# Patient Record
Sex: Female | Born: 1950 | Race: Black or African American | Hispanic: No | Marital: Married | Smoking: Never smoker
Health system: Southern US, Community
[De-identification: ages and names within clinical notes are randomized; demographics above are authoritative.]

## PROBLEM LIST (undated history)

## (undated) DIAGNOSIS — E109 Type 1 diabetes mellitus without complications: Secondary | ICD-10-CM

## (undated) DIAGNOSIS — E0590 Thyrotoxicosis, unspecified without thyrotoxic crisis or storm: Secondary | ICD-10-CM

## (undated) HISTORY — DX: Type 1 diabetes mellitus without complications: E10.9

## (undated) HISTORY — DX: Thyrotoxicosis, unspecified without thyrotoxic crisis or storm: E05.90

---

## 2012-12-07 DIAGNOSIS — I10 Essential (primary) hypertension: Secondary | ICD-10-CM | POA: Insufficient documentation

## 2015-04-28 DIAGNOSIS — E785 Hyperlipidemia, unspecified: Secondary | ICD-10-CM | POA: Insufficient documentation

## 2015-09-18 LAB — TSH: TSH: 0.08 u[IU]/mL — AB (ref <=5.90)

## 2015-09-18 LAB — HEMOGLOBIN A1C: HEMOGLOBIN A1C: 7.9

## 2015-10-06 LAB — TSH: TSH: 0.11 u[IU]/mL — AB (ref <=5.90)

## 2015-11-26 ENCOUNTER — Encounter: Payer: Self-pay | Admitting: "Endocrinology

## 2015-11-26 ENCOUNTER — Ambulatory Visit (INDEPENDENT_AMBULATORY_CARE_PROVIDER_SITE_OTHER): Payer: Medicare Other | Admitting: "Endocrinology

## 2015-11-26 VITALS — BP 122/66 | HR 93 | Resp 18 | Ht 71.0 in | Wt 151.0 lb

## 2015-11-26 DIAGNOSIS — E0590 Thyrotoxicosis, unspecified without thyrotoxic crisis or storm: Secondary | ICD-10-CM | POA: Diagnosis not present

## 2015-11-26 DIAGNOSIS — D509 Iron deficiency anemia, unspecified: Secondary | ICD-10-CM | POA: Insufficient documentation

## 2015-11-26 DIAGNOSIS — IMO0002 Reserved for concepts with insufficient information to code with codable children: Secondary | ICD-10-CM

## 2015-11-26 DIAGNOSIS — M810 Age-related osteoporosis without current pathological fracture: Secondary | ICD-10-CM | POA: Diagnosis not present

## 2015-11-26 DIAGNOSIS — E1065 Type 1 diabetes mellitus with hyperglycemia: Secondary | ICD-10-CM | POA: Diagnosis not present

## 2015-11-26 DIAGNOSIS — E108 Type 1 diabetes mellitus with unspecified complications: Secondary | ICD-10-CM | POA: Diagnosis not present

## 2015-11-26 DIAGNOSIS — E785 Hyperlipidemia, unspecified: Secondary | ICD-10-CM

## 2015-11-26 DIAGNOSIS — I10 Essential (primary) hypertension: Secondary | ICD-10-CM

## 2015-11-26 NOTE — Progress Notes (Signed)
Subjective:    Patient ID: Kristi Estrada, female    DOB: Oct 01, 1950, PCP Lanier Clam, MD.   No past medical history on file. No past surgical history on file. Social History   Social History  . Marital Status: Married    Spouse Name: N/A  . Number of Children: N/A  . Years of Education: N/A   Social History Main Topics  . Smoking status: Never Smoker   . Smokeless tobacco: None  . Alcohol Use: None  . Drug Use: None  . Sexual Activity: Not Asked   Other Topics Concern  . None   Social History Narrative  . None   Outpatient Encounter Prescriptions as of 11/26/2015  Medication Sig  . alendronate (FOSAMAX) 35 MG tablet TAKE 1 TAB BY MOUTH ONCE A WEEK  . aspirin EC 81 MG tablet Take by mouth.  . B-D ULTRAFINE III SHORT PEN 31G X 8 MM MISC USE SUBCUTANEOUSLY 4 TIMES A DAY  . Blood Glucose Monitoring Suppl (FIFTY50 GLUCOSE METER 2.0) w/Device KIT by Does not apply route.  . cholecalciferol (VITAMIN D) 1000 units tablet Take 1,000 Units by mouth daily.  . hydrochlorothiazide (HYDRODIURIL) 25 MG tablet Take 25 mg by mouth daily.  . Insulin Pen Needle (B-D ULTRAFINE III SHORT PEN) 31G X 8 MM MISC   . LEVEMIR FLEXTOUCH 100 UNIT/ML Pen INJECT 25 UNITS SUBCUTANEOUSLY IN THE MORNING AND 15 UNITS AT NIGHT  . losartan (COZAAR) 50 MG tablet Take by mouth.  . Multiple Vitamin (MULTIVITAMIN) tablet Take 1 tablet by mouth daily.  Marland Kitchen NOVOLOG FLEXPEN 100 UNIT/ML FlexPen INJECT 10 UNITS SUBCUTANEOUSLY THREE TIMES A DAY WITH MEALS  . ONETOUCH DELICA LANCETS FINE MISC by Does not apply route.  . pravastatin (PRAVACHOL) 20 MG tablet    No facility-administered encounter medications on file as of 11/26/2015.   ALLERGIES: Allergies  Allergen Reactions  . Metformin Nausea Only   VACCINATION STATUS:  There is no immunization history on file for this patient.  HPI  The patient presents today with a medical history as above, and is being seen in consultation for hyperthyroidism  requested by Dr. Adela Lank.  The patient has been dealing with symptoms of  Palpitations, sleep disturbance, and fatigue for several weeks.  - She was found to have suppressed TSH on 2 occasions suspicious for hyperthyroidism. -She is not on antithyroid therapy. She denies any exposure to external thyroid hormone products. -She underwent thyroid ultrasound on 11/16/2015 at [facility] which showed right lobe 5.3 cm and left lobe 4.8 cm. This imaging also showed 3.7 x 3.5 x 3.2 mm hypoechoic/solid nodule on the left lobe.  The patient has family history of thyroid dysfunction  in her mother and in one of her daughters,  but the patient denies personal history of goiter.  Patient  is willing to proceed with appropriate work up and therapy for thyrotoxicosis. - She has diabetes type unidentified on insulin treatment.  She was diagnosed 15 years ago at age 25. She is currently on Levemir 27 units in the morning and 15 units at bedtime along with NovoLog 10 units 3 times a day before meals. Her most recent A1c was reported at 7.9%.   Review of Systems Constitutional: no weight gain/loss, + fatigue, +subjective hyperthermia Eyes: no blurry vision, no xerophthalmia ENT: no sore throat, no nodules palpated in throat, no dysphagia/odynophagia, no hoarseness Cardiovascular: + palpitations, - leg swelling Respiratory: no cough/SOB Gastrointestinal: no N/V/D/C Musculoskeletal: no muscle/joint aches  Skin: no rashes Neurological: no tremors/numbness/tingling/dizziness Psychiatric: no depression/anxiety   Objective:    BP 122/66 mmHg  Pulse 93  Resp 18  Ht '5\' 11"'  (1.803 m)  Wt 151 lb (68.493 kg)  BMI 21.07 kg/m2  SpO2 98%  Wt Readings from Last 3 Encounters:  11/26/15 151 lb (68.493 kg)    Physical Exam Constitutional: overweight, in NAD Eyes: PERRLA, EOMI, no exophthalmos ENT: moist mucous membranes, mild thyromegaly, no cervical lymphadenopathy Cardiovascular: She  has mild tachycardia of 104, No MRG Respiratory: CTA B Gastrointestinal: abdomen soft, NT, ND, BS+ Musculoskeletal: no deformities, strength intact in all 4 Skin: moist, warm, no rashes Neurological: no tremor with outstretched hands, DTR normal in all 4  Thyroid ultrasound from 11/16/2015 in [facility] showed Right lobe 5.3 cm x 1.6 cm x 1.9 cm heterogenous and appearance. Left lobe 4.8 cm x 1.9 cm x 1.4 cm heterogenous with a 3.7 mm solid/hypoechoic nodule.   Labs on 09/18/2015 showed free T4 normal at 1.12 TSH suppressed at 0.08, A1c 7.9% Thyroid function test on 10/06/2015 showed free T4 normal at 1.11 TSH suppressed at 0.11 On 10/08/2015 her free T3 was normal at 3.4 vitamin D was low normal at 26  Assessment & Plan:   1. Subclinical hyperthyroidism  -I have reviewed her available records and evaluated patient clinically. She seems to have settled signs and symptoms of hyperthyroidism. -The available workup is not sufficient to make treatment decision. She will need thyroid uptake and scan to confirm the activity level in her thyroid. I will arrange to obtain this study at the closer facility to where she lives. She will return in 2 weeks time for treatment decision if necessary. - The potential risks of untreated thyrotoxicosis and the need for definitive therapy have been discussed in detail with the patient, and the patient agrees to proceed with plan.   Therapy may involve RAI ablation of the thyroid, with subsequent need for lifelong thyroid hormone replacement.  - The subcentimeter nodule (3.7 mm on the left lobe) is unremarkable. It would not need any further workup at this time.  2. Osteoporosis - I do not have access for her DEXA scan to review. I have advised her to continue Fosamax weekly which was already started for her.  3. 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 He did not bring the meter nor logs today. Her most recent A1c was 7.9%. I advised her to stay on same treatment regimen and bring her meter and logs during her next visit.  4. Benign hypertension - Controlled, continue current medications including hydrochlorothiazide 25 mg by mouth daily and Cozaar 50 mg by mouth every day.  5. HLD (hyperlipidemia) - Controlled unknown. Have advised her to continue pravastatin 20 mg by mouth daily at bedtime.  - 40 minutes of time was spent on the care of this patient , 50% of which was applied for counseling on complications of thyrotoxicosis, options ,  and the need for definitive therapy to prevent these complications.  - I advised patient to maintain close follow up with Lanier Clam, MD for primary care needs.  Follow up plan: Return in about 2 weeks (around 12/10/2015) for overactive thyroid.  Glade Lloyd, MD Phone: [phone]  Fax: [phone]   11/26/2015, 4:43 PM

## 2015-12-07 ENCOUNTER — Encounter: Payer: Self-pay | Admitting: "Endocrinology

## 2015-12-10 ENCOUNTER — Ambulatory Visit (INDEPENDENT_AMBULATORY_CARE_PROVIDER_SITE_OTHER): Payer: Medicare Other | Admitting: "Endocrinology

## 2015-12-10 ENCOUNTER — Encounter: Payer: Self-pay | Admitting: "Endocrinology

## 2015-12-10 VITALS — BP 153/80 | HR 107 | Wt 151.5 lb

## 2015-12-10 DIAGNOSIS — E0590 Thyrotoxicosis, unspecified without thyrotoxic crisis or storm: Secondary | ICD-10-CM | POA: Diagnosis not present

## 2015-12-10 MED ORDER — PROPRANOLOL HCL 20 MG PO TABS: 10.0000 mg | ORAL_TABLET | Freq: Three times a day (TID) | ORAL | 2 refills | Status: DC

## 2015-12-10 NOTE — Progress Notes (Signed)
Subjective:    Patient ID: Kristi Estrada, female    DOB: May 22, 1950, PCP Lanier Clam, MD.   No past medical history on file. No past surgical history on file. Social History   Social History  . Marital status: Married    Spouse name: N/A  . Number of children: N/A  . Years of education: N/A   Social History Main Topics  . Smoking status: Never Smoker  . Smokeless tobacco: Not on file  . Alcohol use Not on file  . Drug use: Unknown  . Sexual activity: Not on file   Other Topics Concern  . Not on file   Social History Narrative  . No narrative on file   Outpatient Encounter Prescriptions as of 12/10/2015  Medication Sig  . alendronate (FOSAMAX) 35 MG tablet TAKE 1 TAB BY MOUTH ONCE A WEEK  . aspirin EC 81 MG tablet Take by mouth.  . B-D ULTRAFINE III SHORT PEN 31G X 8 MM MISC USE SUBCUTANEOUSLY 4 TIMES A DAY  . Blood Glucose Monitoring Suppl (FIFTY50 GLUCOSE METER 2.0) w/Device KIT by Does not apply route.  . cholecalciferol (VITAMIN D) 1000 units tablet Take 1,000 Units by mouth daily.  . hydrochlorothiazide (HYDRODIURIL) 25 MG tablet Take 25 mg by mouth daily.  . Insulin Pen Needle (B-D ULTRAFINE III SHORT PEN) 31G X 8 MM MISC   . LEVEMIR FLEXTOUCH 100 UNIT/ML Pen INJECT 25 UNITS SUBCUTANEOUSLY IN THE MORNING AND 15 UNITS AT NIGHT  . losartan (COZAAR) 50 MG tablet Take by mouth.  . Multiple Vitamin (MULTIVITAMIN) tablet Take 1 tablet by mouth daily.  Marland Kitchen NOVOLOG FLEXPEN 100 UNIT/ML FlexPen INJECT 10 UNITS SUBCUTANEOUSLY THREE TIMES A DAY WITH MEALS  . ONETOUCH DELICA LANCETS FINE MISC by Does not apply route.  . pravastatin (PRAVACHOL) 20 MG tablet   . propranolol (INDERAL) 20 MG tablet Take 0.5 tablets (10 mg total) by mouth 3 (three) times daily.   No facility-administered encounter medications on file as of 12/10/2015.    ALLERGIES: Allergies  Allergen Reactions  . Metformin Nausea Only   VACCINATION STATUS:  There is no immunization history on file  for this patient.  HPI  The patient presents today with a medical history as above, and is being seen in consultation for hyperthyroidism requested by Dr. Adela Lank.  The patient has been dealing with symptoms of  Palpitations, sleep disturbance, and fatigue for several weeks.  - She was found to have suppressed TSH on 2 occasions suspicious for hyperthyroidism. -She is not on antithyroid therapy. She denies any exposure to external thyroid hormone products. -She underwent thyroid ultrasound on 11/16/2015 at [facility] which showed right lobe 5.3 cm and left lobe 4.8 cm. This imaging also showed 3.7 x 3.5 x 3.2 mm hypoechoic/solid nodule on the left lobe.  The patient has family history of thyroid dysfunction  in her mother and in one of her daughters,  but the patient denies personal history of goiter.  Patient  is willing to proceed with appropriate work up and therapy for thyrotoxicosis. - She has diabetes type unidentified on insulin treatment.  She was diagnosed 15 years ago at age 34. She is currently on Levemir 27 units in the morning and 15 units at bedtime along with NovoLog 10 units 3 times a day before meals. Her most recent A1c was reported at 7.9%.   Review of Systems Constitutional: no weight gain/loss, + fatigue, +subjective hyperthermia Eyes: no blurry vision, no xerophthalmia  ENT: no sore throat, no nodules palpated in throat, no dysphagia/odynophagia, no hoarseness Cardiovascular: + palpitations, - leg swelling Respiratory: no cough/SOB Gastrointestinal: no N/V/D/C Musculoskeletal: no muscle/joint aches Skin: no rashes Neurological: no tremors/numbness/tingling/dizziness Psychiatric: no depression/anxiety   Objective:    BP (!) 153/80   Pulse (!) 107   Wt 151 lb 8 oz (68.7 kg)   BMI 21.13 kg/m   Wt Readings from Last 3 Encounters:  12/10/15 151 lb 8 oz (68.7 kg)  11/26/15 151 lb (68.5 kg)    Physical Exam Constitutional: overweight,  in NAD Eyes: PERRLA, EOMI, no exophthalmos ENT: moist mucous membranes, mild thyromegaly, no cervical lymphadenopathy Cardiovascular: She has mild tachycardia of 104, No MRG Respiratory: CTA B Gastrointestinal: abdomen soft, NT, ND, BS+ Musculoskeletal: no deformities, strength intact in all 4 Skin: moist, warm, no rashes Neurological: no tremor with outstretched hands, DTR normal in all 4  Thyroid ultrasound from 11/16/2015 in [facility] showed Right lobe 5.3 cm x 1.6 cm x 1.9 cm heterogenous and appearance. Left lobe 4.8 cm x 1.9 cm x 1.4 cm heterogenous with a 3.7 mm solid/hypoechoic nodule.   Labs on 09/18/2015 showed free T4 normal at 1.12 TSH suppressed at 0.08, A1c 7.9% Thyroid function test on 10/06/2015 showed free T4 normal at 1.11 TSH suppressed at 0.11 On 10/08/2015 her free T3 was normal at 3.4 vitamin D was low normal at 26  Assessment & Plan:   1. Subclinical hyperthyroidism - Heart thyroid uptake and scan showed 13% uptake (normal 10-30 percent). - Her presentation still is consistent with subclinical hyperthyroidism. She does have clinical tachycardia along with uncontrolled blood pressure. - She would benefit from a low-dose beta blocker, I would initiate propranolol 20 mg by mouth every morning. - She will have repeat TSH, free T4, free T3 and 3 months with office visit.  - The subcentimeter nodule (3.7 mm on the left lobe) is unremarkable. It would not need any further workup at this time.  2. Osteoporosis - I do not have access for her DEXA scan to review. I have advised her to continue Fosamax weekly which was already started for her.  3. 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 She did not bring the meter nor logs today. Her most recent A1c was 7.9%. I advised her to stay on same treatment regimen and bring her meter and logs during  her next visit.  4. Benign hypertension - unontrolled, continue current medications including hydrochlorothiazide 25 mg by mouth daily and Cozaar 50 mg by mouth every day.  5. HLD (hyperlipidemia) - Controlled unknown. Have advised her to continue pravastatin 20 mg by mouth daily at bedtime.  - 40 minutes of time was spent on the care of this patient , 50% of which was applied for counseling on complications of thyrotoxicosis, options ,  and the need for definitive therapy to prevent these complications.  - I advised patient to maintain close follow up with Lanier Clam, MD for primary care needs.  Follow up plan: Return in about 3 months (around 03/11/2016) for follow up with pre-visit labs.  Glade Lloyd, MD Phone: [phone]  Fax: [phone]   12/10/2015, 2:46 PM

## 2016-01-05 ENCOUNTER — Encounter: Payer: Self-pay | Admitting: "Endocrinology

## 2016-02-10 ENCOUNTER — Other Ambulatory Visit: Payer: Self-pay | Admitting: "Endocrinology

## 2016-03-08 ENCOUNTER — Other Ambulatory Visit: Payer: Self-pay | Admitting: "Endocrinology

## 2016-03-08 LAB — T4, FREE: Free T4: 1.5 ng/dL (ref 0.8–1.8)

## 2016-03-08 LAB — TSH: TSH: 0.05 mIU/L — ABNORMAL LOW

## 2016-03-08 LAB — T3, FREE: T3, Free: 3.8 pg/mL (ref 2.3–4.2)

## 2016-03-17 ENCOUNTER — Ambulatory Visit (INDEPENDENT_AMBULATORY_CARE_PROVIDER_SITE_OTHER): Payer: Medicare Other | Admitting: "Endocrinology

## 2016-03-17 ENCOUNTER — Encounter: Payer: Self-pay | Admitting: "Endocrinology

## 2016-03-17 VITALS — BP 140/74 | HR 76 | Ht 71.0 in | Wt 154.0 lb

## 2016-03-17 DIAGNOSIS — E0590 Thyrotoxicosis, unspecified without thyrotoxic crisis or storm: Secondary | ICD-10-CM | POA: Diagnosis not present

## 2016-03-17 MED ORDER — PROPRANOLOL HCL 20 MG PO TABS: 20.0000 mg | ORAL_TABLET | Freq: Two times a day (BID) | ORAL | 3 refills | Status: DC

## 2016-03-17 NOTE — Progress Notes (Signed)
Subjective:    Patient ID: Kristi Estrada, female    DOB: [birth year], PCP Lanier Clam, MD.   History reviewed. No pertinent past medical history. History reviewed. No pertinent surgical history. Social History   Social History  . Marital status: Married    Spouse name: N/A  . Number of children: N/A  . Years of education: N/A   Social History Main Topics  . Smoking status: Never Smoker  . Smokeless tobacco: None  . Alcohol use None  . Drug use: Unknown  . Sexual activity: Not Asked   Other Topics Concern  . None   Social History Narrative  . None   Outpatient Encounter Prescriptions as of 03/17/2016  Medication Sig  . alendronate (FOSAMAX) 35 MG tablet TAKE 1 TAB BY MOUTH ONCE A WEEK  . aspirin EC 81 MG tablet Take by mouth.  . B-D ULTRAFINE III SHORT PEN 31G X 8 MM MISC USE SUBCUTANEOUSLY 4 TIMES A DAY  . Blood Glucose Monitoring Suppl (FIFTY50 GLUCOSE METER 2.0) w/Device KIT by Does not apply route.  . cholecalciferol (VITAMIN D) 1000 units tablet Take 1,000 Units by mouth daily.  . hydrochlorothiazide (HYDRODIURIL) 25 MG tablet Take 25 mg by mouth daily.  . Insulin Pen Needle (B-D ULTRAFINE III SHORT PEN) 31G X 8 MM MISC   . LEVEMIR FLEXTOUCH 100 UNIT/ML Pen INJECT 25 UNITS SUBCUTANEOUSLY IN THE MORNING AND 15 UNITS AT NIGHT  . losartan (COZAAR) 50 MG tablet Take by mouth.  . Multiple Vitamin (MULTIVITAMIN) tablet Take 1 tablet by mouth daily.  Marland Kitchen NOVOLOG FLEXPEN 100 UNIT/ML FlexPen INJECT 10 UNITS SUBCUTANEOUSLY THREE TIMES A DAY WITH MEALS  . ONETOUCH DELICA LANCETS FINE MISC by Does not apply route.  . pravastatin (PRAVACHOL) 20 MG tablet   . propranolol (INDERAL) 20 MG tablet Take 1 tablet (20 mg total) by mouth 2 (two) times daily.  . [DISCONTINUED] propranolol (INDERAL) 20 MG tablet TAKE 1/2 TABLETS (10 MG TOTAL) BY MOUTH 3 (THREE) TIMES DAILY.   No facility-administered encounter medications on file as of 03/17/2016.    ALLERGIES: Allergies   Allergen Reactions  . Metformin Nausea Only   VACCINATION STATUS:  There is no immunization history on file for this patient.  HPI  The patient presents today with a medical history as above, and is being seen in f/u for  Subclinical hyperthyroidism. She is on observation on propranolol 60m po BID.  - Shedoes not have a new complaints, she has regained 3 lbs. -She is not on antithyroid therapy. She denies any exposure to external thyroid hormone products. -[facility] and left lobe 4.8 cm. This imaging also showed 3.7 x 3.5 x 3.2 mm hypoechoic/solid nodule on the left lobe.  The patient has family history of thyroid dysfunction  in her mother and in one of her daughters,  but the patient denies personal history of goiter.  Patient  is willing to proceed with appropriate work up and therapy for thyrotoxicosis. - She has diabetes type unidentified on insulin treatment.  She was diagnosed 15 years ago at age 65 She is currently on Levemir 27 units in the morning and 15 units at bedtime along with NovoLog 10 units 3 times a day before meals. Her most recent A1c was reported at 7.9%.   Review of Systems Constitutional: + weight gain, -  fatigue, +subjective hyperthermia Eyes: no blurry vision, no xerophthalmia ENT: no  sore throat, no nodules palpated in throat, no dysphagia/odynophagia, no hoarseness Cardiovascular: + palpitations, - leg swelling Respiratory: no cough/SOB Gastrointestinal: no N/V/D/C Musculoskeletal: no muscle/joint aches Skin: no rashes Neurological: no tremors/numbness/tingling/dizziness Psychiatric: no depression/anxiety   Objective:    BP 140/74   Pulse 76   Ht _0  (1.803 m)   Wt 154 lb (69.9 kg)   BMI 21.48 kg/m   Wt Readings from Last 3 Encounters:  03/17/16 154 lb (69.9 kg)  12/10/15 151 lb 8 oz (68.7 kg)  11/26/15 151 lb (68.5 kg)    Physical  Exam Constitutional: in NAD Eyes: PERRLA, EOMI, no exophthalmos ENT: moist mucous membranes, mild thyromegaly, no cervical lymphadenopathy Cardiovascular: She has mild tachycardia of 104, No MRG Respiratory: CTA B Gastrointestinal: abdomen soft, NT, ND, BS+ Musculoskeletal: no deformities, strength intact in all 4 Skin: moist, warm, no rashes Neurological: no tremor with outstretched hands, DTR normal in all 4    Thyroid ultrasound from 11/16/2015 in [facility] showed: Right lobe 5.3 cm x 1.6 cm x 1.9 cm heterogenous and appearance. Left lobe 4.8 cm x 1.9 cm x 1.4 cm heterogenous with a 3.7 mm solid/hypoechoic nodule.  Results for TANI, VIRGO (MRN [id number]) as of 03/17/2016 13:49  Ref. Range 09/18/2015 00:00 10/06/2015 00:00 03/08/2016 11:21  Hemoglobin A1C Unknown 7.9    TSH Latest Units: mIU/L 0.08 (A) 0.11 (A) 0.05 (L)  Triiodothyronine,Free,Serum Latest Ref Range: 2.3 - 4.2 pg/mL   3.8  T4,Free(Direct) Latest Ref Range: 0.8 - 1.8 ng/dL   1.5     Assessment & Plan:   1. Subclinical hyperthyroidism - Her labs and  presentation still is consistent with subclinical hyperthyroidism. Free t4 and free t3 are WNL and TSH is improving to 0.05. She has clinically  responded to low dose propranolol , I will continue 20 MG PO BID. - Her prior thyroid uptake and scan showed 13% uptake (normal 10-30 percent).  - The subcentimeter nodule (3.7 mm on the left lobe) is unremarkable. It will not need any further workup at this time. She will have a f/u thyroid sonogram in 1 year.  2. Osteoporosis - I do not have access for her DEXA scan to review. I have advised her to continue Fosamax weekly which was already started for her.  3. 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 She did not bring the meter nor logs today. Her most recent A1c was 7.9%. She did not bring any  meter nor logs, and patient would like to f/u for diabetes with her PMD.   4. Benign hypertension - Controlled, continue current medications including hydrochlorothiazide 25 mg by mouth daily and Cozaar 50 mg by mouth every day.  5. HLD (hyperlipidemia) - Controlled unknown. Have advised her to continue pravastatin 20 mg by mouth daily at bedtime.  - 30 minutes of time was spent on the care of this patient , 50% of which was applied for counseling on complications of thyrotoxicosis, options ,  and the need for definitive therapy to prevent these complications.  - I advised patient to maintain close follow up with Lanier Clam, MD for primary care needs.  Follow up plan: Return in about 6 months (around 09/14/2016) for follow up with pre-visit labs.  Glade Lloyd, MD Phone: [phone]  Fax: [phone]   03/17/2016, 2:01 PM

## 2016-05-12 ENCOUNTER — Other Ambulatory Visit: Payer: Self-pay | Admitting: "Endocrinology

## 2016-09-08 LAB — TSH: TSH: 0.01 u[IU]/mL — AB (ref 0.41–5.90)

## 2016-09-15 ENCOUNTER — Ambulatory Visit (INDEPENDENT_AMBULATORY_CARE_PROVIDER_SITE_OTHER): Payer: Medicare Other | Admitting: "Endocrinology

## 2016-09-15 ENCOUNTER — Encounter: Payer: Self-pay | Admitting: "Endocrinology

## 2016-09-15 VITALS — BP 130/77 | HR 102 | Ht 71.0 in | Wt 143.0 lb

## 2016-09-15 DIAGNOSIS — E0590 Thyrotoxicosis, unspecified without thyrotoxic crisis or storm: Secondary | ICD-10-CM | POA: Diagnosis not present

## 2016-09-15 MED ORDER — PROPRANOLOL HCL 20 MG PO TABS: ORAL_TABLET | ORAL | 3 refills | Status: DC

## 2016-09-15 NOTE — Progress Notes (Signed)
Subjective:    Patient ID: Kristi Estrada, female    DOB: 18-May-1950, PCP Lanier Clam, MD.   No past medical history on file. No past surgical history on file. Social History   Social History  . Marital status: Married    Spouse name: N/A  . Number of children: N/A  . Years of education: N/A   Social History Main Topics  . Smoking status: Never Smoker  . Smokeless tobacco: Never Used  . Alcohol use No  . Drug use: No  . Sexual activity: Not Asked   Other Topics Concern  . None   Social History Narrative  . None   Outpatient Encounter Prescriptions as of 09/15/2016  Medication Sig  . alendronate (FOSAMAX) 35 MG tablet TAKE 1 TAB BY MOUTH ONCE A WEEK  . aspirin EC 81 MG tablet Take by mouth.  . B-D ULTRAFINE III SHORT PEN 31G X 8 MM MISC USE SUBCUTANEOUSLY 4 TIMES A DAY  . Blood Glucose Monitoring Suppl (FIFTY50 GLUCOSE METER 2.0) w/Device KIT by Does not apply route.  . cholecalciferol (VITAMIN D) 1000 units tablet Take 1,000 Units by mouth daily.  . hydrochlorothiazide (HYDRODIURIL) 25 MG tablet Take 25 mg by mouth daily.  . Insulin Pen Needle (B-D ULTRAFINE III SHORT PEN) 31G X 8 MM MISC   . LEVEMIR FLEXTOUCH 100 UNIT/ML Pen INJECT 25 UNITS SUBCUTANEOUSLY IN THE MORNING AND 15 UNITS AT NIGHT  . losartan (COZAAR) 50 MG tablet Take by mouth.  . Multiple Vitamin (MULTIVITAMIN) tablet Take 1 tablet by mouth daily.  Marland Kitchen NOVOLOG FLEXPEN 100 UNIT/ML FlexPen INJECT 10 UNITS SUBCUTANEOUSLY THREE TIMES A DAY WITH MEALS  . ONETOUCH DELICA LANCETS FINE MISC by Does not apply route.  . pravastatin (PRAVACHOL) 20 MG tablet   . propranolol (INDERAL) 20 MG tablet Take 1 tablet (20 mg total) by mouth 2 (two) times daily.  . propranolol (INDERAL) 20 MG tablet TAKE 1/2 TABLETS (10 MG TOTAL) BY MOUTH 3 (THREE) TIMES DAILY.   No facility-administered encounter medications on file as of 09/15/2016.    ALLERGIES: Allergies  Allergen Reactions  . Metformin Nausea Only    VACCINATION STATUS:  There is no immunization history on file for this patient.  HPI  The patient presents today with a medical history as above, and is being seen in f/u for  Subclinical hyperthyroidism. She is on observation on propranolol 43m po BID.  - She does have a new complaints: Including weight loss of 11 pounds, palpitations, sleep disturbance.  -She is not on antithyroid therapy. She denies any exposure to external thyroid hormone products. -[facility] 5.3 cm and left lobe 4.8 cm. This imaging also showed 3.7 x 3.5 x 3.2 mm hypoechoic/solid nodule on the left lobe. - Heart thyroid uptake and scan from July 2017 was unremarkable at 13%.  The patient has family history of thyroid dysfunction  in her mother and in one of her daughters,  but the patient denies personal history of goiter.  Patient  is willing to proceed with appropriate work up and therapy for thyrotoxicosis. - She has diabetes type unidentified on insulin treatment.  She was diagnosed 15 years ago at age 66 She is currently on Levemir 27 units in the morning and 15 units at bedtime along with NovoLog 10 units 3 times a day before meals. Her most recent A1c was reported at 7.9%.   Review of Systems  Constitutional: + weight loss, -  fatigue, +subjective hyperthermia Eyes: no blurry vision, no xerophthalmia ENT: no sore throat, no nodules palpated in throat, no dysphagia/odynophagia, no hoarseness Cardiovascular: + palpitations, - leg swelling Respiratory: no cough/SOB Gastrointestinal: no N/V/D/C Musculoskeletal: no muscle/joint aches Skin: no rashes Neurological: no tremors/numbness/tingling/dizziness Psychiatric: no depression/anxiety   Objective:    BP 130/77   Pulse (!) 102   Ht '5\' 11"'  (1.803 m)   Wt 143 lb (64.9 kg)   BMI 19.94 kg/m   Wt Readings from Last 3 Encounters:  09/15/16 143 lb (64.9 kg)   03/17/16 154 lb (69.9 kg)  12/10/15 151 lb 8 oz (68.7 kg)    Physical Exam Constitutional: in NAD Eyes: PERRLA, EOMI, no exophthalmos ENT: moist mucous membranes, mild thyromegaly, no cervical lymphadenopathy Cardiovascular: She has mild tachycardia of 104, No MRG Respiratory: CTA B Gastrointestinal: abdomen soft, NT, ND, BS+ Musculoskeletal: no deformities, strength intact in all 4 Skin: moist, warm, no rashes Neurological: no tremor with outstretched hands, DTR normal in all 4  Results for DUANA, BENEDICT (MRN [id number]) as of 09/15/2016 14:10  Ref. Range 03/08/2016 11:21 09/08/2016 00:00  TSH Latest Ref Range: 0.41 - 5.90 uIU/mL 0.05 (L) 0.01 (A) Free T4: 3.04  Triiodothyronine,Free,Serum Latest Ref Range: 2.3 - 4.2 pg/mL 3.8   T4,Free(Direct) Latest Ref Range: 0.8 - 1.8 ng/dL 1.5     Thyroid [facility] 11/16/2015 [facility] showed: Right lobe 5.3 cm x 1.6 cm x 1.9 cm heterogenous and appearance. Left lobe 4.8 cm x 1.9 cm x 1.4 cm heterogenous with a 3.7 mm solid/hypoechoic nodule.    Assessment & Plan:   1.  hyperthyroidism - Her labs And clinical presentation indicate that she has not hyperthyroidism. - Her prior thyroid uptake and scan showed 13% uptake (normal 10-30 percent). - She will need another confirmatory thyroid uptake and scan for treatment decision. She would need higher dose of propranolol, I would increase propranolol to 20 mg by mouth twice a day. - If her repeat thyroid uptake and scan at least with her thyroid function tests, she will be considered for radioactive iodine thyroid ablation.  - The subcentimeter nodule (3.7 mm on the left lobe) is unremarkable. It will not need any further workup at this time.   2. Osteoporosis - I do not have access for her DEXA scan to review. I have advised her to continue Fosamax weekly which was already started for her.  3. 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 She did not bring the meter nor logs today. Her most recent A1c was 7.9%. She did not bring any meter nor logs, and patient would like to f/u for diabetes with her PMD.   4. Benign hypertension - Controlled, continue current medications including hydrochlorothiazide 25 mg by mouth daily and Cozaar 50 mg by mouth every day.  5. HLD (hyperlipidemia) - Controlled unknown. Have advised her to continue pravastatin 20 mg by mouth daily at bedtime.  - 30 minutes of time was spent on the care of this patient , 50% of which was applied for counseling on complications of thyrotoxicosis, options ,  and the need for definitive therapy to prevent these complications.  - I advised patient to maintain close follow up with Lanier Clam, MD for primary care needs.  Follow up plan: Return in about 2 weeks (around 09/29/2016) for thyroid  uptake and scan.  Glade Lloyd, MD Phone: [phone]  Fax: [phone]   09/15/2016, 2:07 PM

## 2016-09-16 ENCOUNTER — Other Ambulatory Visit: Payer: Self-pay

## 2016-09-16 MED ORDER — PROPRANOLOL HCL 20 MG PO TABS: 20.0000 mg | ORAL_TABLET | Freq: Two times a day (BID) | ORAL | 0 refills | Status: DC

## 2016-09-20 ENCOUNTER — Encounter (HOSPITAL_COMMUNITY)
Admission: RE | Admit: 2016-09-20 | Discharge: 2016-09-20 | Disposition: A | Discharge status: Home / Self Care | Payer: Medicare Other | Source: Ambulatory Visit | Attending: "Endocrinology | Admitting: "Endocrinology

## 2016-09-20 ENCOUNTER — Encounter (HOSPITAL_COMMUNITY): Payer: Self-pay

## 2016-09-20 DIAGNOSIS — E0590 Thyrotoxicosis, unspecified without thyrotoxic crisis or storm: Secondary | ICD-10-CM

## 2016-09-20 MED ORDER — SODIUM IODIDE I-123 7.4 MBQ CAPS
400.0000 | ORAL_CAPSULE | Freq: Once | ORAL | Status: AC
  Administered 2016-09-20: 369 via ORAL

## 2016-09-21 ENCOUNTER — Encounter (HOSPITAL_COMMUNITY)
Admission: RE | Admit: 2016-09-21 | Discharge: 2016-09-21 | Disposition: A | Discharge status: Home / Self Care | Payer: Medicare Other | Source: Ambulatory Visit | Attending: "Endocrinology | Admitting: "Endocrinology

## 2016-09-21 DIAGNOSIS — E0590 Thyrotoxicosis, unspecified without thyrotoxic crisis or storm: Secondary | ICD-10-CM | POA: Diagnosis present

## 2016-09-22 ENCOUNTER — Other Ambulatory Visit: Payer: Self-pay

## 2016-09-22 MED ORDER — PROPRANOLOL HCL 20 MG PO TABS: 20.0000 mg | ORAL_TABLET | Freq: Two times a day (BID) | ORAL | 0 refills | Status: DC

## 2016-09-28 ENCOUNTER — Ambulatory Visit (INDEPENDENT_AMBULATORY_CARE_PROVIDER_SITE_OTHER): Payer: Medicare Other | Admitting: "Endocrinology

## 2016-09-28 ENCOUNTER — Encounter: Payer: Self-pay | Admitting: "Endocrinology

## 2016-09-28 VITALS — BP 127/80 | HR 79 | Ht 71.0 in | Wt 142.0 lb

## 2016-09-28 DIAGNOSIS — E0500 Thyrotoxicosis with diffuse goiter without thyrotoxic crisis or storm: Secondary | ICD-10-CM | POA: Diagnosis not present

## 2016-09-28 DIAGNOSIS — E0590 Thyrotoxicosis, unspecified without thyrotoxic crisis or storm: Secondary | ICD-10-CM

## 2016-09-28 NOTE — Progress Notes (Signed)
Subjective:    Patient ID: Kristi Estrada, female    DOB: 09/21/1950, PCP Lanier Clam, MD.   No past medical history on file. No past surgical history on file. Social History   Social History  . Marital status: Married    Spouse name: N/A  . Number of children: N/A  . Years of education: N/A   Social History Main Topics  . Smoking status: Never Smoker  . Smokeless tobacco: Never Used  . Alcohol use No  . Drug use: No  . Sexual activity: Not Asked   Other Topics Concern  . None   Social History Narrative  . None   Outpatient Encounter Prescriptions as of 09/28/2016  Medication Sig  . alendronate (FOSAMAX) 35 MG tablet TAKE 1 TAB BY MOUTH ONCE A WEEK  . aspirin EC 81 MG tablet Take by mouth.  . B-D ULTRAFINE III SHORT PEN 31G X 8 MM MISC USE SUBCUTANEOUSLY 4 TIMES A DAY  . Blood Glucose Monitoring Suppl (FIFTY50 GLUCOSE METER 2.0) w/Device KIT by Does not apply route.  . cholecalciferol (VITAMIN D) 1000 units tablet Take 1,000 Units by mouth daily.  . hydrochlorothiazide (HYDRODIURIL) 25 MG tablet Take 25 mg by mouth daily.  . Insulin Pen Needle (B-D ULTRAFINE III SHORT PEN) 31G X 8 MM MISC   . LEVEMIR FLEXTOUCH 100 UNIT/ML Pen INJECT 25 UNITS SUBCUTANEOUSLY IN THE MORNING AND 15 UNITS AT NIGHT  . losartan (COZAAR) 50 MG tablet Take by mouth.  . Multiple Vitamin (MULTIVITAMIN) tablet Take 1 tablet by mouth daily.  Marland Kitchen NOVOLOG FLEXPEN 100 UNIT/ML FlexPen INJECT 10 UNITS SUBCUTANEOUSLY THREE TIMES A DAY WITH MEALS  . ONETOUCH DELICA LANCETS FINE MISC by Does not apply route.  . pravastatin (PRAVACHOL) 20 MG tablet   . propranolol (INDERAL) 20 MG tablet Take 1 tablet (20 mg total) by mouth 2 (two) times daily.   No facility-administered encounter medications on file as of 09/28/2016.    ALLERGIES: Allergies  Allergen Reactions  . Metformin Nausea Only   VACCINATION STATUS:  There is no immunization history on file for this patient.  HPI  The patient  presents today with a medical history as above, and is being seen in f/u for  Subclinical hyperthyroidism. She is on observation on propranolol 44m po BID.  - She does have a new complaints: Including weight loss of 11 pounds, palpitations, sleep disturbance.  -She is not on antithyroid therapy. She denies any exposure to external thyroid hormone products. -[facility] and left lobe 4.8 cm. This imaging also showed 3.7 x 3.5 x 3.2 mm hypoechoic/solid nodule on the left lobe. - Her repeat thyroid uptake and scan shows that she has uniform uptake of 50.8% consistent with Graves' disease.  The patient has family history of thyroid dysfunction  in her mother and in one of her daughters,  but the patient denies personal history of goiter.  Patient  is willing to proceed with appropriate work up and therapy for thyrotoxicosis. - She has diabetes type unidentified on insulin treatment.  She was diagnosed 15 years ago at age 89 She is currently on Levemir 27 units in the morning and 15 units at bedtime along with NovoLog 10 units 3 times a day before meals. Her most recent A1c was reported at 7.9%.   Review of Systems Constitutional: + weight loss, -  fatigue, +subjective hyperthermia Eyes: no blurry vision,  no xerophthalmia ENT: no sore throat, no nodules palpated in throat, no dysphagia/odynophagia, no hoarseness Cardiovascular: + palpitations, - leg swelling Respiratory: no cough/SOB Gastrointestinal: no N/V/D/C Musculoskeletal: no muscle/joint aches Skin: no rashes Neurological: no tremors/numbness/tingling/dizziness Psychiatric: no depression/anxiety   Objective:    BP 127/80   Pulse 79   Ht '5\' 11"'  (1.803 m)   Wt 142 lb (64.4 kg)   BMI 19.80 kg/m   Wt Readings from Last 3 Encounters:  09/28/16 142 lb (64.4 kg)  09/15/16 143 lb (64.9 kg)  03/17/16 154 lb (69.9 kg)    Physical  Exam Constitutional: in NAD Eyes: PERRLA, EOMI, no exophthalmos ENT: moist mucous membranes, mild thyromegaly, no cervical lymphadenopathy Cardiovascular: She has mild tachycardia of 104, No MRG Respiratory: CTA B Gastrointestinal: abdomen soft, NT, ND, BS+ Musculoskeletal: no deformities, strength intact in all 4 Skin: moist, warm, no rashes Neurological: no tremor with outstretched hands, DTR normal in all 4  Results for Kristi, Estrada (MRN [id number]) as of 09/15/2016 14:10  Ref. Range 03/08/2016 11:21 09/08/2016 00:00  TSH Latest Ref Range: 0.41 - 5.90 uIU/mL 0.05 (L) 0.01 (A) Free T4: 3.04  Triiodothyronine,Free,Serum Latest Ref Range: 2.3 - 4.2 pg/mL 3.8   T4,Free(Direct) Latest Ref Range: 0.8 - 1.8 ng/dL 1.5     Thyroid ultrasound from 11/16/2015 in [facility] showed: Right lobe 5.3 cm x 1.6 cm x 1.9 cm heterogenous and appearance. Left lobe 4.8 cm x 1.9 cm x 1.4 cm heterogenous with a 3.7 mm solid/hypoechoic nodule.  -  On 09/21/2016: Thyroid uptake and scan showed 50.8% of uniform uptake consistent with Graves' disease.   Assessment & Plan:   1.  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 -Options of therapy discussed with her and she agrees with RAI thyroid ablation. She understands the subsequent need for thyroid hormone replacement. This treatment will be scheduled to be administered as soon as possible. -She will return in 9 weeks with repeat thyroid function test.  She will benefit from propranolol, I advised her to continue propranolol  20 mg by mouth twice a day.   - The subcentimeter nodule (3.7 mm on the left lobe) is unremarkable. It will not need any further workup at this time.   2. Osteoporosis - I do not have access for her DEXA scan to review. I have advised her to continue Fosamax weekly which was already started for her.  3. Uncontrolled type 1 diabetes mellitus with complication  (HCC) - Given her body habitus, it is likely that her diabetes is type I instead of type II. She did not bring the meter nor logs today. Her most recent A1c was 7.9%. She did not bring any meter nor logs, and patient would like to f/u for diabetes with her PMD.   4. Benign hypertension - Controlled, continue current medications including hydrochlorothiazide 25 mg by mouth daily and Cozaar 50 mg by mouth every day.  5. HLD (hyperlipidemia) - Controlled unknown. Have advised her to continue pravastatin 20 mg by mouth daily at bedtime.  - I advised patient to maintain close follow up with Lanier Clam, MD for primary care needs.  Follow up plan: Return in about 9 weeks (around 11/30/2016) for follow up with labs after I131 therapy.  Glade Lloyd, MD Phone: [phone]  Fax: [phone]   09/28/2016, 1:12 PM

## 2016-09-29 ENCOUNTER — Ambulatory Visit: Payer: Medicare Other | Admitting: "Endocrinology

## 2016-10-07 ENCOUNTER — Encounter (HOSPITAL_COMMUNITY)
Admission: RE | Admit: 2016-10-07 | Discharge: 2016-10-07 | Disposition: A | Discharge status: Home / Self Care | Payer: Medicare Other | Source: Ambulatory Visit | Attending: "Endocrinology | Admitting: "Endocrinology

## 2016-10-07 ENCOUNTER — Encounter (HOSPITAL_COMMUNITY): Payer: Self-pay

## 2016-10-07 DIAGNOSIS — E0590 Thyrotoxicosis, unspecified without thyrotoxic crisis or storm: Secondary | ICD-10-CM | POA: Diagnosis present

## 2016-10-07 IMAGING — NM NM RAI THERAPY FOR HYPERTHYROIDISM
2 series · 2 of 2 positions shown · non-contrast
Comparison: Thyroid uptake and scan [DATE]

CLINICAL DATA: Hyperthyroidism, Graves disease

EXAM:
RADIOACTIVE IODINE THERAPY FOR HYPERTHYROIDISM
TECHNIQUE: Procedure, benefits and risks of radioactive iodine therapy for
hyperthyroidism were discussed with the patient, as well as
alternatives.

[Series 1: bone statics · 2.07mm/px · 1 of 1 slices shown (1 of 2)]
[im 1/1]
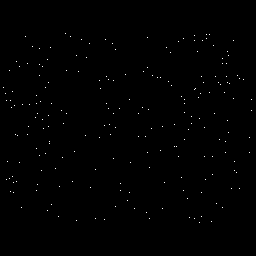

[Series 1: bone statics · 2.07mm/px · 1 of 1 slices shown (2 of 2)]
[im 1/1]
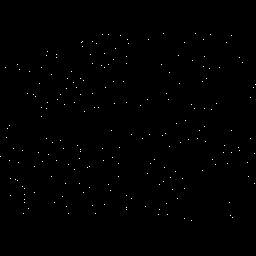

[2 of 2 positions shown; findings below may reference images not displayed]

Radiation safety issues and procedures were discussed, including
minimization of radiation risk to family and general public.

Written safety instructions were provided to the patient.

Patient's questions were answered.

Written informed consent for the therapy was obtained.

Timeout protocol followed.

Patient then received the radiopharmaceutical without immediate
complication.

RADIOPHARMACEUTICALS:  12.2 mCi [DH] sodium iodide orally
IMPRESSION: Per oral administration of 12.2 mCi of [DH] sodium iodide for the
treatment of hyperthyroidism.

## 2016-10-07 MED ORDER — SODIUM IODIDE I 131 CAPSULE
12.0000 | Freq: Once | INTRAVENOUS | Status: AC | PRN
  Administered 2016-10-07: 12 via ORAL

## 2016-11-29 LAB — TSH: TSH: 0.11 — AB (ref 0.41–5.90)

## 2016-12-06 ENCOUNTER — Encounter: Payer: Self-pay | Admitting: "Endocrinology

## 2016-12-06 ENCOUNTER — Ambulatory Visit (INDEPENDENT_AMBULATORY_CARE_PROVIDER_SITE_OTHER): Payer: Medicare Other | Admitting: "Endocrinology

## 2016-12-06 VITALS — BP 142/76 | HR 71 | Ht 71.0 in | Wt 146.0 lb

## 2016-12-06 DIAGNOSIS — IMO0002 Reserved for concepts with insufficient information to code with codable children: Secondary | ICD-10-CM

## 2016-12-06 DIAGNOSIS — E890 Postprocedural hypothyroidism: Secondary | ICD-10-CM | POA: Insufficient documentation

## 2016-12-06 DIAGNOSIS — E0500 Thyrotoxicosis with diffuse goiter without thyrotoxic crisis or storm: Secondary | ICD-10-CM | POA: Diagnosis not present

## 2016-12-06 DIAGNOSIS — E108 Type 1 diabetes mellitus with unspecified complications: Secondary | ICD-10-CM

## 2016-12-06 DIAGNOSIS — E1065 Type 1 diabetes mellitus with hyperglycemia: Secondary | ICD-10-CM

## 2016-12-06 NOTE — Progress Notes (Signed)
Subjective:    Patient ID: Kristi Estrada, female    DOB: [birth year], PCP Lanier Clam, MD.   History reviewed. No pertinent past medical history. History reviewed. No pertinent surgical history. Social History   Social History  . Marital status: Married    Spouse name: N/A  . Number of children: N/A  . Years of education: N/A   Social History Main Topics  . Smoking status: Never Smoker  . Smokeless tobacco: Never Used  . Alcohol use No  . Drug use: No  . Sexual activity: Not Asked   Other Topics Concern  . None   Social History Narrative  . None   Outpatient Encounter Prescriptions as of 12/06/2016  Medication Sig  . alendronate (FOSAMAX) 35 MG tablet TAKE 1 TAB BY MOUTH ONCE A WEEK  . aspirin EC 81 MG tablet Take by mouth.  . B-D ULTRAFINE III SHORT PEN 31G X 8 MM MISC USE SUBCUTANEOUSLY 4 TIMES A DAY  . Blood Glucose Monitoring Suppl (FIFTY50 GLUCOSE METER 2.0) w/Device KIT by Does not apply route.  . cholecalciferol (VITAMIN D) 1000 units tablet Take 1,000 Units by mouth daily.  . hydrochlorothiazide (HYDRODIURIL) 25 MG tablet Take 25 mg by mouth daily.  . Insulin Pen Needle (B-D ULTRAFINE III SHORT PEN) 31G X 8 MM MISC   . LEVEMIR FLEXTOUCH 100 UNIT/ML Pen INJECT 25 UNITS SUBCUTANEOUSLY IN THE MORNING AND 15 UNITS AT NIGHT  . losartan (COZAAR) 50 MG tablet Take by mouth.  . Multiple Vitamin (MULTIVITAMIN) tablet Take 1 tablet by mouth daily.  Marland Kitchen NOVOLOG FLEXPEN 100 UNIT/ML FlexPen INJECT 10 UNITS SUBCUTANEOUSLY THREE TIMES A DAY WITH MEALS  . ONETOUCH DELICA LANCETS FINE MISC by Does not apply route.  . pravastatin (PRAVACHOL) 20 MG tablet   . [DISCONTINUED] propranolol (INDERAL) 20 MG tablet Take 1 tablet (20 mg total) by mouth 2 (two) times daily.   No facility-administered encounter medications on file as of 12/06/2016.    ALLERGIES: Allergies  Allergen Reactions  . Metformin Nausea Only   VACCINATION STATUS:  There is no immunization history on  file for this patient.  HPI  The patient presents today with a medical history as above, and is being seen in f/u for   Graves' disease status post  RAI Therapy on 10/07/2016.  - He reports improvement in her clinical symptoms. -She underwent thyroid ultrasound on 11/16/[facility] which showed right lobe 5.3 cm and left lobe 4.8 cm. This imaging also showed 3.7 x 3.5 x 3.2 mm hypoechoic/solid nodule on the left lobe. - Her repeat thyroid uptake and scan shows that she has uniform uptake of 50.8% consistent with Graves' disease.  The patient has family history of thyroid dysfunction  in her mother and in one of her daughters,  but the patient denies personal history of goiter.   - She has diabetes type unidentified on insulin treatment.  She was diagnosed 15 years ago at age 59. She is currently on Levemir 27 units in the morning and 15 units at bedtime along with NovoLog 10 units 3 times a day before meals. Her most recent A1c was reported at 7.9%.   Review of Systems Constitutional: + weight gain, -  fatigue, -subjective hyperthermia Eyes: no blurry vision, no xerophthalmia ENT: no sore throat, no nodules palpated in throat, no dysphagia/odynophagia, no hoarseness Cardiovascular: + palpitations, - leg swelling Respiratory: no cough/SOB Gastrointestinal: no N/V/D/C Musculoskeletal: no muscle/joint aches Skin: no rashes Neurological: no  tremors/numbness/tingling/dizziness Psychiatric: no depression/anxiety   Objective:    BP (!) 142/76   Pulse 71   Ht '5\' 11"'  (1.803 m)   Wt 146 lb (66.2 kg)   BMI 20.36 kg/m   Wt Readings from Last 3 Encounters:  12/06/16 146 lb (66.2 kg)  09/28/16 142 lb (64.4 kg)  09/15/16 143 lb (64.9 kg)    Physical Exam Constitutional: in NAD Eyes: PERRLA, EOMI, no exophthalmos ENT: moist mucous membranes, mild thyromegaly, no cervical lymphadenopathy Cardiovascular: She has mild tachycardia of 104, No MRG Respiratory: CTA  B Gastrointestinal: abdomen soft, NT, ND, BS+ Musculoskeletal: no deformities, strength intact in all 4 Skin: moist, warm, no rashes Neurological: no tremor with outstretched hands, DTR normal in all 4  11/29/2016 TSH 0.11, free T4 0.95  Thyroid ultrasound from 11/16/2015 in [facility] showed: Right lobe 5.3 cm x 1.6 cm x 1.9 cm heterogenous and appearance. Left lobe 4.8 cm x 1.9 cm x 1.4 cm heterogenous with a 3.7 mm solid/hypoechoic nodule.  -  On 09/21/2016: Thyroid uptake and scan showed 50.8% of uniform uptake consistent with Graves' disease.   Assessment & Plan:   1.  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 - Based on her thyroid function tests, she is not hypothyroid unit. -She will return in 8 weeks with repeat thyroid function test. - I advised her to discontinue propranolol for now. - The subcentimeter nodule (3.7 mm on the left lobe) is unremarkable. It will not need any further workup at this time.   2. Osteoporosis - I do not have access for her DEXA scan to review. I have advised her to continue Fosamax weekly which was already started for her.  3. 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 She did not bring the meter nor logs today. Her most recent A1c was 7.9%. She did not bring any meter nor logs, and patient would like to f/u for diabetes with her PMD.   4. Benign hypertension - Controlled, continue current medications including hydrochlorothiazide 25 mg by mouth daily and Cozaar 50 mg by mouth every day.  5. HLD (hyperlipidemia) - Controlled unknown. Have advised her to continue pravastatin 20 mg by mouth daily at bedtime.  - I advised patient to maintain close follow up with Lanier Clam, MD for primary care needs.  Follow up  plan: Return in about 8 weeks (around 01/31/2017) for follow up with pre-visit labs.  Glade Lloyd, MD Phone: [phone]  Fax: [phone]   12/06/2016, 4:26 PM

## 2017-01-31 ENCOUNTER — Ambulatory Visit (INDEPENDENT_AMBULATORY_CARE_PROVIDER_SITE_OTHER): Payer: Medicare Other | Admitting: "Endocrinology

## 2017-01-31 ENCOUNTER — Encounter: Payer: Self-pay | Admitting: "Endocrinology

## 2017-01-31 VITALS — BP 139/83 | HR 83 | Ht 71.0 in | Wt 156.0 lb

## 2017-01-31 DIAGNOSIS — E890 Postprocedural hypothyroidism: Secondary | ICD-10-CM | POA: Diagnosis not present

## 2017-01-31 MED ORDER — LEVOTHYROXINE SODIUM 88 MCG PO TABS: 88.0000 ug | ORAL_TABLET | Freq: Every day | ORAL | 3 refills | Status: DC

## 2017-01-31 NOTE — Progress Notes (Signed)
Subjective:    Patient ID: Kristi Estrada, female    DOB: [birth year], PCP Lanier Clam, MD.   No past medical history on file. No past surgical history on file. Social History   Social History  . Marital status: Married    Spouse name: N/A  . Number of children: N/A  . Years of education: N/A   Social History Main Topics  . Smoking status: Never Smoker  . Smokeless tobacco: Never Used  . Alcohol use No  . Drug use: No  . Sexual activity: Not on file   Other Topics Concern  . Not on file   Social History Narrative  . No narrative on file   Outpatient Encounter Prescriptions as of 01/31/2017  Medication Sig  . alendronate (FOSAMAX) 35 MG tablet TAKE 1 TAB BY MOUTH ONCE A WEEK  . aspirin EC 81 MG tablet Take by mouth.  . B-D ULTRAFINE III SHORT PEN 31G X 8 MM MISC USE SUBCUTANEOUSLY 4 TIMES A DAY  . cholecalciferol (VITAMIN D) 1000 units tablet Take 1,000 Units by mouth daily.  . hydrochlorothiazide (HYDRODIURIL) 25 MG tablet Take 25 mg by mouth daily.  Marland Kitchen LEVEMIR FLEXTOUCH 100 UNIT/ML Pen INJECT 25 UNITS SUBCUTANEOUSLY IN THE MORNING AND 15 UNITS AT NIGHT  . levothyroxine (SYNTHROID, LEVOTHROID) 88 MCG tablet Take 1 tablet (88 mcg total) by mouth daily before breakfast.  . losartan (COZAAR) 50 MG tablet Take by mouth.  . Multiple Vitamin (MULTIVITAMIN) tablet Take 1 tablet by mouth daily.  Marland Kitchen NOVOLOG FLEXPEN 100 UNIT/ML FlexPen INJECT 10 UNITS SUBCUTANEOUSLY THREE TIMES A DAY WITH MEALS  . ONETOUCH DELICA LANCETS FINE MISC by Does not apply route.  . pravastatin (PRAVACHOL) 20 MG tablet   . [DISCONTINUED] Blood Glucose Monitoring Suppl (FIFTY50 GLUCOSE METER 2.0) w/Device KIT by Does not apply route.  . [DISCONTINUED] Insulin Pen Needle (B-D ULTRAFINE III SHORT PEN) 31G X 8 MM MISC    No facility-administered encounter medications on file as of 01/31/2017.    ALLERGIES: Allergies  Allergen Reactions  . Metformin Nausea Only   VACCINATION STATUS:  There is  no immunization history on file for this patient.  HPI  The patient presents today with a medical history as above, and is being seen in f/u for   Graves' disease status post  RAI Therapy on 10/07/2016.  - He reports improvement in her clinical symptoms. -She underwent thyroid ultrasound on 11/16/2015 at [facility] which showed right lobe 5.3 cm and left lobe 4.8 cm. This imaging also showed 3.7 x 3.5 x 3.2 mm hypoechoic/solid nodule on the left lobe. - Her repeat thyroid uptake and scan shows that she has uniform uptake of 50.8% consistent with Graves' disease.  The patient has family history of thyroid dysfunction  in her mother and in one of her daughters,  but the patient denies personal history of goiter.   - She has diabetes type unidentified on insulin treatment.  She was diagnosed 15 years ago at age 36. She is currently on Levemir 27 units in the morning and 15 units at bedtime along with NovoLog 10 units 3 times a day before meals. Her most recent A1c was reported at 7.9%, she wishes to continue care with PMD for diabetes.   Review of Systems Constitutional: + weight gain, + fatigue, -subjective hyperthermia Eyes: no blurry vision, no xerophthalmia ENT: no sore throat, no nodules palpated in throat, no dysphagia/odynophagia, no hoarseness Cardiovascular: + palpitations, - leg swelling Respiratory:  no cough/SOB Gastrointestinal: no N/V/D/C Musculoskeletal: no muscle/joint aches Skin: no rashes Neurological: no tremors/numbness/tingling/dizziness Psychiatric: no depression/anxiety   Objective:    BP 139/83   Pulse 83   Ht '5\' 11"'  (1.803 m)   Wt 156 lb (70.8 kg)   BMI 21.76 kg/m   Wt Readings from Last 3 Encounters:  01/31/17 156 lb (70.8 kg)  12/06/16 146 lb (66.2 kg)  09/28/16 142 lb (64.4 kg)    Physical Exam Constitutional: in NAD Eyes: PERRLA, EOMI, no exophthalmos ENT: moist mucous membranes, mild thyromegaly, no cervical  lymphadenopathy Cardiovascular: She has mild tachycardia of 104, No MRG Respiratory: CTA B Gastrointestinal: abdomen soft, NT, ND, BS+ Musculoskeletal: no deformities, strength intact in all 4 Skin: moist, warm, no rashes Neurological: no tremor with outstretched hands, DTR normal in all 4  11/29/2016 TSH 0.11, free T4 0.95 01/24/2017 TSH 110. 7, free T4 0.12  Thyroid ultrasound from 11/16/2015 in [facility] showed: Right lobe 5.3 cm x 1.6 cm x 1.9 cm heterogenous and appearance. Left lobe 4.8 cm x 1.9 cm x 1.4 cm heterogenous with a 3.7 mm solid/hypoechoic nodule.  -  On 09/21/2016: Thyroid uptake and scan showed 50.8% of uniform uptake consistent with Graves' disease.   Assessment & Plan:   1. 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 - Based on her thyroid function tests, she is now hypothyroid . - I discussed and initiated thyroid hormone replacement therapy .  - I will start with levothyroxine 88 g by mouth every morning.  - We discussed about correct intake of levothyroxine, at fasting, with water, separated by at least 30 minutes from breakfast, and separated by more than 4 hours from calcium, iron, multivitamins, acid reflux medications (PPIs). -Patient is made aware of the fact that thyroid hormone replacement is needed for life, dose to be adjusted by periodic monitoring of thyroid function tests. - The subcentimeter nodule (3.7 mm on the left lobe) is unremarkable. It will not need any further workup at this time.  - I advised patient to maintain close follow up with Lanier Clam, MD for primary care needs.  Follow up plan: Return in about 3 months (around 05/02/2017) for follow up with pre-visit labs.  Glade Lloyd, MD Phone: [phone]  Fax: [phone]   This note was partially dictated with voice recognition software. Similar  sounding words can be transcribed inadequately or may not  be corrected upon review.  01/31/2017, 2:40 PM

## 2017-05-10 ENCOUNTER — Encounter: Payer: Self-pay | Admitting: "Endocrinology

## 2017-05-10 ENCOUNTER — Ambulatory Visit: Payer: Medicare FFS | Admitting: "Endocrinology

## 2017-05-10 VITALS — BP 136/84 | HR 85 | Ht 71.0 in | Wt 145.0 lb

## 2017-05-10 DIAGNOSIS — E0500 Thyrotoxicosis with diffuse goiter without thyrotoxic crisis or storm: Secondary | ICD-10-CM | POA: Diagnosis not present

## 2017-05-10 DIAGNOSIS — E890 Postprocedural hypothyroidism: Secondary | ICD-10-CM | POA: Diagnosis not present

## 2017-05-10 MED ORDER — LEVOTHYROXINE SODIUM 75 MCG PO TABS: 75.0000 ug | ORAL_TABLET | Freq: Every day | ORAL | 4 refills | Status: DC

## 2017-05-10 NOTE — Progress Notes (Signed)
Subjective:    Patient ID: Kristi Estrada, female    DOB: 05/27/1950, PCP Remigio EisenmengerBalakrishnan, Shyam E, MD.   History reviewed. No pertinent past medical history. History reviewed. No pertinent surgical history. Social History   Socioeconomic History  . Marital status: Married    Spouse name: None  . Number of children: None  . Years of education: None  . Highest education level: None  Social Needs  . Financial resource strain: None  . Food insecurity - worry: None  . Food insecurity - inability: None  . Transportation needs - medical: None  . Transportation needs - non-medical: None  Occupational History  . None  Tobacco Use  . Smoking status: Never Smoker  . Smokeless tobacco: Never Used  Substance and Sexual Activity  . Alcohol use: No    Alcohol/week: 0.0 oz  . Drug use: No  . Sexual activity: None  Other Topics Concern  . None  Social History Narrative  . None   Outpatient Encounter Medications as of 05/10/2017  Medication Sig  . alendronate (FOSAMAX) 35 MG tablet TAKE 1 TAB BY MOUTH ONCE A WEEK  . aspirin EC 81 MG tablet Take by mouth.  . B-D ULTRAFINE III SHORT PEN 31G X 8 MM MISC USE SUBCUTANEOUSLY 4 TIMES A DAY  . cholecalciferol (VITAMIN D) 1000 units tablet Take 1,000 Units by mouth daily.  . hydrochlorothiazide (HYDRODIURIL) 25 MG tablet Take 25 mg by mouth daily.  Marland Kitchen. LEVEMIR FLEXTOUCH 100 UNIT/ML Pen INJECT 25 UNITS SUBCUTANEOUSLY IN THE MORNING AND 15 UNITS AT NIGHT  . levothyroxine (SYNTHROID, LEVOTHROID) 75 MCG tablet Take 1 tablet (75 mcg total) by mouth daily before breakfast.  . losartan (COZAAR) 50 MG tablet Take by mouth.  . Multiple Vitamin (MULTIVITAMIN) tablet Take 1 tablet by mouth daily.  Marland Kitchen. NOVOLOG FLEXPEN 100 UNIT/ML FlexPen INJECT 10 UNITS SUBCUTANEOUSLY THREE TIMES A DAY WITH MEALS  . ONETOUCH DELICA LANCETS FINE MISC by Does not apply route.  . pravastatin (PRAVACHOL) 20 MG tablet   . [DISCONTINUED] levothyroxine (SYNTHROID, LEVOTHROID) 88 MCG  tablet Take 1 tablet (88 mcg total) by mouth daily before breakfast.   No facility-administered encounter medications on file as of 05/10/2017.    ALLERGIES: Allergies  Allergen Reactions  . Metformin Nausea Only   VACCINATION STATUS:  There is no immunization history on file for this patient.  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  - She was initiated on levothyroxine 88 g by mouth every morning. She tolerated this medication very well with subsequent improvement in her clinical symptoms. -She [facility] which showed right lobe 5.3 cm and left lobe 4.8 cm. This imaging also showed 3.7 x 3.5 x 3.2 mm hypoechoic/solid nodule on the left lobe. - Her repeat thyroid uptake and scan shows that she has uniform uptake of 50.8% consistent with Graves' disease.  The patient has family history of thyroid dysfunction  in her mother and in one of her daughters,  but the patient denies personal history of goiter.   - She has diabetes type unidentified on insulin treatment.  She was diagnosed 15 years ago at age 67. She is currently on Levemir 27 units in the morning and 15 units at bedtime along with NovoLog 10 units 3 times a day before meals. Her most  recent A1c was reported at 7.9%, she wishes to continue care with PMD for diabetes. - She is currently grieving from the sudden death of her husband in 05-13-17.   Review of Systems Constitutional: + weight loss, - fatigue, -subjective hyperthermia Eyes: no blurry vision, no xerophthalmia ENT: no sore throat, no nodules palpated in throat, no dysphagia/odynophagia, no hoarseness Cardiovascular: + palpitations, - leg swelling Respiratory: no cough/SOB Gastrointestinal: no N/V/D/C Musculoskeletal: no muscle/joint aches Skin: no  rashes Neurological: no tremors/numbness/tingling/dizziness Psychiatric: no depression/anxiety   Objective:    BP 136/84   Pulse 85   Ht 5\' 11"  (1.803 m)   Wt 145 lb (65.8 kg)   BMI 20.22 kg/m   Wt Readings from Last 3 Encounters:  05/10/17 145 lb (65.8 kg)  01/31/17 156 lb (70.8 kg)  12/06/16 146 lb (66.2 kg)    Physical Exam Constitutional: in NAD Eyes: PERRLA, EOMI, no exophthalmos ENT: moist mucous membranes, mild thyromegaly, no cervical lymphadenopathy Cardiovascular: She has mild tachycardia of 104, No MRG Respiratory: CTA B Gastrointestinal: abdomen soft, NT, ND, BS+ Musculoskeletal: no deformities, strength intact in all 4 Skin: moist, warm, no rashes Neurological: no tremor with outstretched hands, DTR normal in all 4  Labs from 05/01/2017 showed TSH 6.33, free T4 2.17  Thyroid ultrasound from 11/16/2015 in [facility] showed: Right lobe 5.3 cm x 1.6 cm x 1.9 cm heterogenous and appearance. Left lobe 4.8 cm x 1.9 cm x 1.4 cm heterogenous with a 3.7 mm solid/hypoechoic nodule.  -  On 09/21/2016: Thyroid uptake and scan showed 50.8% of uniform uptake consistent with Graves' disease.   Assessment & Plan:   1. 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 Graves' disease-  Resolved 3. Nodular goiter - Based on her current thyroid function tests, she will be considered for a lower dose of levothyroxine. - I discussed and lowered her levothyroxine to 75 g by mouth every morning.   - We discussed about correct intake of levothyroxine, at fasting, with water, separated by at least 30 minutes from breakfast, and separated by more than 4 hours from calcium, iron, multivitamins, acid reflux medications (PPIs). -Patient is made aware of the fact that thyroid hormone replacement is needed for life, dose to be adjusted by periodic monitoring of thyroid function tests. - The subcentimeter nodules (3.7 mm on the left lobe) is unremarkable. It will not need any  further workup at this time.  - I advised patient to maintain close follow up with Remigio Eisenmenger, MD for primary care needs.  Follow up plan: Return in about 5 months (around 10/08/2017) for follow up with pre-visit labs.  Marquis Lunch, MD Phone: [phone]  Fax: [phone]   This note was partially dictated with voice recognition software. Similar sounding words can be transcribed inadequately or may not  be corrected upon review.  05/10/2017, 2:03 PM

## 2017-05-31 ENCOUNTER — Other Ambulatory Visit: Payer: Self-pay | Admitting: "Endocrinology

## 2017-09-04 ENCOUNTER — Other Ambulatory Visit: Payer: Self-pay | Admitting: "Endocrinology

## 2017-10-03 ENCOUNTER — Encounter: Payer: Self-pay | Admitting: "Endocrinology

## 2017-10-09 ENCOUNTER — Encounter: Payer: Self-pay | Admitting: "Endocrinology

## 2017-10-09 ENCOUNTER — Ambulatory Visit (INDEPENDENT_AMBULATORY_CARE_PROVIDER_SITE_OTHER): Payer: Medicare HMO | Admitting: "Endocrinology

## 2017-10-09 VITALS — BP 119/75 | HR 80 | Ht 71.0 in | Wt 152.0 lb

## 2017-10-09 DIAGNOSIS — E890 Postprocedural hypothyroidism: Secondary | ICD-10-CM

## 2017-10-09 DIAGNOSIS — E0500 Thyrotoxicosis with diffuse goiter without thyrotoxic crisis or storm: Secondary | ICD-10-CM

## 2017-10-09 NOTE — Progress Notes (Signed)
Subjective:    Patient ID: Kristi Estrada, female    DOB: 1951-03-29, PCP Kristi Estrada, Kristi Estrada, Kristi Estrada.   Past Medical History:  Diagnosis Date  . Diabetes mellitus type I (HCC)   . Hyperthyroidism    History reviewed. No pertinent surgical history. Social History   Socioeconomic History  . Marital status: Married    Spouse name: Not on file  . Number of children: Not on file  . Years of education: Not on file  . Highest education level: Not on file  Occupational History  . Not on file  Social Needs  . Financial resource strain: Not on file  . Food insecurity:    Worry: Not on file    Inability: Not on file  . Transportation needs:    Medical: Not on file    Non-medical: Not on file  Tobacco Use  . Smoking status: Never Smoker  . Smokeless tobacco: Never Used  Substance and Sexual Activity  . Alcohol use: No    Alcohol/week: 0.0 oz  . Drug use: No  . Sexual activity: Not on file  Lifestyle  . Physical activity:    Days per week: Not on file    Minutes per session: Not on file  . Stress: Not on file  Relationships  . Social connections:    Talks on phone: Not on file    Gets together: Not on file    Attends religious service: Not on file    Active member of club or organization: Not on file    Attends meetings of clubs or organizations: Not on file    Relationship status: Not on file  Other Topics Concern  . Not on file  Social History Narrative  . Not on file   Outpatient Encounter Medications as of 10/09/2017  Medication Sig  . alendronate (FOSAMAX) 35 MG tablet TAKE 1 TAB BY MOUTH ONCE A WEEK  . aspirin EC 81 MG tablet Take by mouth.  . B-D ULTRAFINE III SHORT PEN 31G X 8 MM MISC USE SUBCUTANEOUSLY 4 TIMES A DAY  . cholecalciferol (VITAMIN D) 1000 units tablet Take 1,000 Units by mouth daily.  . hydrochlorothiazide (HYDRODIURIL) 25 MG tablet Take 25 mg by mouth daily.  Marland Kitchen. LEVEMIR FLEXTOUCH 100 UNIT/ML Pen INJECT 25 UNITS SUBCUTANEOUSLY IN THE MORNING AND  15 UNITS AT NIGHT  . levothyroxine (SYNTHROID, LEVOTHROID) 75 MCG tablet TAKE 1 TABLET BY MOUTH DAILY BEFORE BREAKFAST  . losartan (COZAAR) 50 MG tablet Take by mouth.  . mirtazapine (REMERON) 15 MG tablet at bedtime.  . Multiple Vitamin (MULTIVITAMIN) tablet Take 1 tablet by mouth daily.  Marland Kitchen. NOVOLOG FLEXPEN 100 UNIT/ML FlexPen INJECT 10 UNITS SUBCUTANEOUSLY THREE TIMES A DAY WITH MEALS  . pravastatin (PRAVACHOL) 20 MG tablet   . [DISCONTINUED] ONETOUCH DELICA LANCETS FINE MISC by Does not apply route.   No facility-administered encounter medications on file as of 10/09/2017.    ALLERGIES: Allergies  Allergen Reactions  . Metformin Nausea Only   VACCINATION STATUS:  There is no immunization history on file for this patient.  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  - She is currently on levothyroxine 75 mcg p.o. daily in the morning .  She reports compliance.  She continues to feel better, no new complaints today.  -24-hour thyroid uptake was uniform at 50.8% prior to treatment. -[facility] which showed right lobe 5.3 cm and left lobe 4.8 cm. This imaging also showed 3.7 x 3.5 x 3.2 mm hypoechoic/solid nodule on the left lobe-only on observation at this time.  The patient has family history of thyroid dysfunction  in her mother and in one of her daughters,  but the patient denies personal history of goiter.   - She has diabetes type unidentified on insulin treatment.  She was diagnosed 15 years ago at age 25. She is currently on Levemir 27 units in the morning and 15 units at bedtime along with NovoLog 10 units 3 times a day before meals. Her most recent A1c was reported at 7.9%, she wishes to continue care with PMD for diabetes.  Review of Systems Constitutional: + weight gain,  - fatigue, -subjective hyperthermia Eyes: no  blurry vision, no xerophthalmia ENT: no sore throat, no nodules palpated in throat, no dysphagia, no odynophagia.  Cardiovascular: + palpitations, - leg swelling Respiratory: no cough/SOB Gastrointestinal: no N/V/D/C Musculoskeletal: no muscle/joint aches Skin: no rashes Neurological: no tremors/numbness/tingling/dizziness Psychiatric: no depression/anxiety   Objective:    BP 119/75   Pulse 80   Ht 5\' 11"  (1.803 m)   Wt 152 lb (68.9 kg)   BMI 21.20 kg/m   Wt Readings from Last 3 Encounters:  10/09/17 152 lb (68.9 kg)  05/10/17 145 lb (65.8 kg)  01/31/17 156 lb (70.8 kg)    Physical Exam Constitutional: in NAD Eyes: PERRLA, EOMI, no exophthalmos ENT: moist mucous membranes, mild thyromegaly, no cervical lymphadenopathy Musculoskeletal: no deformities, strength intact in all 4 Skin: moist, warm, no rashes Neurological: no tremor with outstretched hands.  Labs from Oct 03, 2017 showed TSH 10.59, free T4 1.47.  Labs from 05/01/2017 showed TSH 6.33, free T4 2.17  Thyroid ultrasound from 11/16/2015 in [facility] showed: Right lobe 5.3 cm x 1.6 cm x 1.9 cm heterogenous and appearance. Left lobe 4.8 cm x 1.9 cm x 1.4 cm heterogenous with a 3.7 mm solid/hypoechoic nodule.  -  On 09/21/2016: Thyroid uptake and scan showed 50.8% of uniform uptake consistent with Graves' disease.   Assessment & Plan:   1. 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 Graves' disease-  Resolved 3. Nodular goiter -Her current thyroid function tests are consistent with appropriate replacement.  -I discussed and continued levothyroxine at 75 mcg p.o. am.   - We discussed about correct intake of levothyroxine, at fasting, with water, separated by at least 30 minutes from breakfast, and separated by more than 4 hours from calcium, iron, multivitamins, acid reflux medications (PPIs). -Patient is made aware of the fact that thyroid hormone replacement is needed for life, dose to be  adjusted by periodic monitoring of thyroid function tests.  - I advised patient to maintain close follow up with Kristi Eisenmenger, Kristi Estrada for primary care needs.  Follow up plan: Return in about 6 months (around 04/10/2018) for follow up with pre-visit labs.  Kristi Lunch, Kristi Estrada Phone: [phone]  Fax: [phone]   This note was partially dictated with voice recognition software. Similar sounding words can be transcribed inadequately or may not  be corrected upon review.  10/09/2017, 2:15 PM

## 2017-11-12 ENCOUNTER — Other Ambulatory Visit: Payer: Self-pay | Admitting: "Endocrinology

## 2017-11-13 ENCOUNTER — Other Ambulatory Visit: Payer: Self-pay

## 2017-11-13 MED ORDER — LEVOTHYROXINE SODIUM 75 MCG PO TABS: ORAL_TABLET | ORAL | 1 refills | Status: DC

## 2018-04-03 LAB — TSH: TSH: 12.87 — AB (ref 0.41–5.90)

## 2018-04-10 ENCOUNTER — Encounter: Payer: Self-pay | Admitting: "Endocrinology

## 2018-04-10 ENCOUNTER — Ambulatory Visit (INDEPENDENT_AMBULATORY_CARE_PROVIDER_SITE_OTHER): Payer: Managed Care, Other (non HMO) | Admitting: "Endocrinology

## 2018-04-10 VITALS — BP 134/86 | HR 89 | Ht 71.0 in | Wt 170.0 lb

## 2018-04-10 DIAGNOSIS — E890 Postprocedural hypothyroidism: Secondary | ICD-10-CM | POA: Diagnosis not present

## 2018-04-10 MED ORDER — LEVOTHYROXINE SODIUM 88 MCG PO TABS: ORAL_TABLET | ORAL | 2 refills | Status: DC

## 2018-04-10 NOTE — Progress Notes (Signed)
Endocrinology follow-up note  Subjective:    Patient ID: Kristi Estrada, female    DOB: January 15, 1951, PCP Remigio EisenmengerBalakrishnan, Shyam E, MD.   Past Medical History:  Diagnosis Date  . Diabetes mellitus type I (HCC)   . Hyperthyroidism    History reviewed. No pertinent surgical history. Social History   Socioeconomic History  . Marital status: Married    Spouse name: Not on file  . Number of children: Not on file  . Years of education: Not on file  . Highest education level: Not on file  Occupational History  . Not on file  Social Needs  . Financial resource strain: Not on file  . Food insecurity:    Worry: Not on file    Inability: Not on file  . Transportation needs:    Medical: Not on file    Non-medical: Not on file  Tobacco Use  . Smoking status: Never Smoker  . Smokeless tobacco: Never Used  Substance and Sexual Activity  . Alcohol use: No    Alcohol/week: 0.0 standard drinks  . Drug use: No  . Sexual activity: Not on file  Lifestyle  . Physical activity:    Days per week: Not on file    Minutes per session: Not on file  . Stress: Not on file  Relationships  . Social connections:    Talks on phone: Not on file    Gets together: Not on file    Attends religious service: Not on file    Active member of club or organization: Not on file    Attends meetings of clubs or organizations: Not on file    Relationship status: Not on file  Other Topics Concern  . Not on file  Social History Narrative  . Not on file   Outpatient Encounter Medications as of 04/10/2018  Medication Sig  . alendronate (FOSAMAX) 35 MG tablet TAKE 1 TAB BY MOUTH ONCE A WEEK  . aspirin EC 81 MG tablet Take by mouth.  . B-D ULTRAFINE III SHORT PEN 31G X 8 MM MISC USE SUBCUTANEOUSLY 4 TIMES A DAY  . cholecalciferol (VITAMIN D) 1000 units tablet Take 1,000 Units by mouth daily.  . hydrochlorothiazide (HYDRODIURIL) 25 MG tablet Take 25 mg by mouth daily.  Marland Kitchen. LEVEMIR FLEXTOUCH 100 UNIT/ML Pen INJECT  25 UNITS SUBCUTANEOUSLY IN THE MORNING AND 15 UNITS AT NIGHT  . levothyroxine (SYNTHROID, LEVOTHROID) 88 MCG tablet TAKE 1 TABLET BY MOUTH DAILY BEFORE BREAKFAST  . losartan (COZAAR) 50 MG tablet Take by mouth.  . mirtazapine (REMERON) 15 MG tablet at bedtime.  . Multiple Vitamin (MULTIVITAMIN) tablet Take 1 tablet by mouth daily.  Marland Kitchen. NOVOLOG FLEXPEN 100 UNIT/ML FlexPen INJECT 10 UNITS SUBCUTANEOUSLY THREE TIMES A DAY WITH MEALS  . pravastatin (PRAVACHOL) 20 MG tablet   . [DISCONTINUED] levothyroxine (SYNTHROID, LEVOTHROID) 75 MCG tablet TAKE 1 TABLET BY MOUTH DAILY BEFORE BREAKFAST   No facility-administered encounter medications on file as of 04/10/2018.    ALLERGIES: Allergies  Allergen Reactions  . Metformin Nausea Only   VACCINATION STATUS:  There is no immunization history on file for this patient.  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  - She is currently on levothyroxine 75 mcg p.o. daily in the morning .  She reports compliance.  She continues to feel better, no new complaints today.  She has gained weight since last visit. -24-hour thyroid uptake was  uniform at 50.8% prior to treatment.  The patient has family history of thyroid dysfunction  in her mother and in one of her daughters. - She has diabetes type unidentified on insulin treatment.  She was diagnosed 15 years ago at age 41. She is currently on Levemir 27 units in the morning and 15 units at bedtime along with NovoLog 10 units 3 times a day before meals. Her most recent A1c was reported at 7.9%, she wishes to continue care with PMD for diabetes.  Review of Systems Constitutional: + weight gain,  - fatigue, -subjective hyperthermia Eyes: no blurry vision, no xerophthalmia ENT: no sore throat, no nodules palpated in throat, no dysphagia, no odynophagia.  Cardiovascular: + palpitations, - leg swelling Musculoskeletal: no  muscle/joint aches Skin: no rashes Neurological: no tremors/numbness/tingling/dizziness Psychiatric: no depression/anxiety   Objective:    BP 134/86   Pulse 89   Ht 5\' 11"  (1.803 m)   Wt 170 lb (77.1 kg)   BMI 23.71 kg/m   Wt Readings from Last 3 Encounters:  04/10/18 170 lb (77.1 kg)  10/09/17 152 lb (68.9 kg)  05/10/17 145 lb (65.8 kg)    Physical Exam Constitutional: Not in acute distress, normal state of mind. Eyes: PERRLA, EOMI, no exophthalmos ENT: moist mucous membranes, mild thyromegaly, no cervical lymphadenopathy Musculoskeletal: no deformities, strength intact in all 4 Skin: moist, warm, no rashes Neurological: no tremor with outstretched hands.  Recent Results (from the past 2160 hour(s))  TSH     Status: Abnormal   Collection Time: 04/03/18 12:00 AM  Result Value Ref Range   TSH 12.87 (A) 0.41 - 5.90    Comment:  free t4 1.43     Thyroid ultrasound from 11/16/2015 in [facility] showed: Right lobe 5.3 cm x 1.6 cm x 1.9 cm heterogenous and appearance. Left lobe 4.8 cm x 1.9 cm x 1.4 cm heterogenous with a 3.7 mm solid/hypoechoic nodule.  -  On 09/21/2016: Thyroid uptake and scan showed 50.8% of uniform uptake consistent with Graves' disease.   Assessment & Plan:   1. 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 Graves' disease-  Resolved  -Her previsit thyroid function tests are consistent with inadequate replacement.   -I discussed and increased her levothyroxine to 88 mcg p.o. every morning.     - We discussed about correct intake of levothyroxine, at fasting, with water, separated by at least 30 minutes from breakfast, and separated by more than 4 hours from calcium, iron, multivitamins, acid reflux medications (PPIs). -Patient is made aware of the fact that thyroid hormone replacement is needed for life, dose to be adjusted by periodic monitoring of thyroid function tests.     - I advised patient to maintain close follow up with  Remigio Eisenmenger, MD for her diabetes and primary care needs.  Follow up plan: Return in about 3 months (around 07/10/2018) for Follow up with Pre-visit Labs.  Marquis Lunch, MD Phone: [phone]  Fax: [phone]   This note was partially dictated with voice recognition software. Similar sounding words can be transcribed inadequately or may not  be corrected upon review.  04/10/2018, 2:45 PM

## 2018-05-23 ENCOUNTER — Other Ambulatory Visit: Payer: Self-pay | Admitting: "Endocrinology

## 2018-05-23 MED ORDER — LEVOTHYROXINE SODIUM 88 MCG PO TABS: ORAL_TABLET | ORAL | 2 refills | Status: DC

## 2018-07-03 LAB — TSH: TSH: 2.47 (ref 0.41–5.90)

## 2018-07-10 ENCOUNTER — Ambulatory Visit (INDEPENDENT_AMBULATORY_CARE_PROVIDER_SITE_OTHER): Payer: Medicare HMO | Admitting: "Endocrinology

## 2018-07-10 ENCOUNTER — Encounter: Payer: Self-pay | Admitting: "Endocrinology

## 2018-07-10 VITALS — BP 134/78 | HR 92 | Resp 14 | Ht 70.0 in | Wt 171.2 lb

## 2018-07-10 DIAGNOSIS — E890 Postprocedural hypothyroidism: Secondary | ICD-10-CM | POA: Diagnosis not present

## 2018-07-10 MED ORDER — LEVOTHYROXINE SODIUM 75 MCG PO TABS: ORAL_TABLET | ORAL | 3 refills | Status: DC

## 2018-07-10 NOTE — Progress Notes (Signed)
Endocrinology follow-up note  Subjective:    Patient ID: Kristi Estrada, female    DOB: 07-28-1950, PCP Remigio Eisenmenger, MD.   Past Medical History:  Diagnosis Date  . Diabetes mellitus type I (HCC)   . Hyperthyroidism    History reviewed. No pertinent surgical history. Social History   Socioeconomic History  . Marital status: Married    Spouse name: Not on file  . Number of children: Not on file  . Years of education: Not on file  . Highest education level: Not on file  Occupational History  . Not on file  Social Needs  . Financial resource strain: Not on file  . Food insecurity:    Worry: Not on file    Inability: Not on file  . Transportation needs:    Medical: Not on file    Non-medical: Not on file  Tobacco Use  . Smoking status: Never Smoker  . Smokeless tobacco: Never Used  Substance and Sexual Activity  . Alcohol use: No    Alcohol/week: 0.0 standard drinks  . Drug use: No  . Sexual activity: Not on file  Lifestyle  . Physical activity:    Days per week: Not on file    Minutes per session: Not on file  . Stress: Not on file  Relationships  . Social connections:    Talks on phone: Not on file    Gets together: Not on file    Attends religious service: Not on file    Active member of club or organization: Not on file    Attends meetings of clubs or organizations: Not on file    Relationship status: Not on file  Other Topics Concern  . Not on file  Social History Narrative  . Not on file   Outpatient Encounter Medications as of 07/10/2018  Medication Sig  . alendronate (FOSAMAX) 35 MG tablet TAKE 1 TAB BY MOUTH ONCE A WEEK  . aspirin EC 81 MG tablet Take by mouth.  . B-D ULTRAFINE III SHORT PEN 31G X 8 MM MISC USE SUBCUTANEOUSLY 4 TIMES A DAY  . cholecalciferol (VITAMIN D) 1000 units tablet Take 1,000 Units by mouth daily.  . hydrochlorothiazide (HYDRODIURIL) 25 MG tablet Take 25 mg by mouth daily.  Marland Kitchen LEVEMIR FLEXTOUCH 100 UNIT/ML Pen INJECT 25  UNITS SUBCUTANEOUSLY IN THE MORNING AND 15 UNITS AT NIGHT  . levothyroxine (SYNTHROID, LEVOTHROID) 75 MCG tablet TAKE 1 TABLET BY MOUTH DAILY BEFORE BREAKFAST  . losartan (COZAAR) 50 MG tablet Take by mouth.  . mirtazapine (REMERON) 15 MG tablet at bedtime.  . Multiple Vitamin (MULTIVITAMIN) tablet Take 1 tablet by mouth daily.  Marland Kitchen NOVOLOG FLEXPEN 100 UNIT/ML FlexPen INJECT 10 UNITS SUBCUTANEOUSLY THREE TIMES A DAY WITH MEALS  . pravastatin (PRAVACHOL) 20 MG tablet   . [DISCONTINUED] levothyroxine (SYNTHROID, LEVOTHROID) 88 MCG tablet TAKE 1 TABLET BY MOUTH DAILY BEFORE BREAKFAST   No facility-administered encounter medications on file as of 07/10/2018.    ALLERGIES: Allergies  Allergen Reactions  . Metformin Nausea Only   VACCINATION STATUS:  There is no immunization history on file for this patient.  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  - She is currently on levothyroxine 88 mcg p.o. daily before breakfast.  She reports compliance to her medication.  She continues to feel better, has no new complaints today.    -She has a steady weight since last  visit. -24-hour thyroid uptake was uniform at 50.8% prior to treatment.  The patient has family history of thyroid dysfunction  in her mother and in one of her daughters. - She has diabetes type unidentified on insulin treatment.  She was diagnosed 15 years ago at age 74. She is currently on Levemir 27 units in the morning and 15 units at bedtime along with NovoLog 10 units 3 times a day before meals. Her most recent A1c was reported at 7.9%, she wishes to continue care with PMD for diabetes.  Review of Systems Constitutional: + Steady weight, no fatigue, no subjective hypo-/hyperthermia.  Eyes: no blurry vision, no xerophthalmia ENT: no sore throat, no nodules palpated in throat, no dysphagia, no odynophagia.  Cardiovascular: No  palpitations, Musculoskeletal: no muscle/joint aches Skin: no rashes Neurological: no tremors, no tingling.  Psychiatric: no depression/anxiety   Objective:    BP 134/78   Pulse 92   Resp 14   Ht 5\' 10"  (1.778 m)   Wt 171 lb 3.2 oz (77.7 kg)   SpO2 97%   BMI 24.56 kg/m   Wt Readings from Last 3 Encounters:  07/10/18 171 lb 3.2 oz (77.7 kg)  04/10/18 170 lb (77.1 kg)  10/09/17 152 lb (68.9 kg)    Physical Exam Constitutional: Not in acute distress, normal state of mind. Eyes: PERRLA, EOMI, no exophthalmos ENT: moist mucous membranes, mild thyromegaly, no cervical lymphadenopathy Musculoskeletal: no deformities, strength intact in all 4 Skin: moist, warm, no rashes Neurological: no tremor with outstretched hands.    Thyroid ultrasound from 11/16/2015 in [facility] showed: Right lobe 5.3 cm x 1.6 cm x 1.9 cm heterogenous and appearance. Left lobe 4.8 cm x 1.9 cm x 1.4 cm heterogenous with a 3.7 mm solid/hypoechoic nodule.  -  On 09/21/2016: Thyroid uptake and scan showed 50.8% of uniform uptake consistent with Graves' disease. Recent Results (from the past 2160 hour(s))  TSH     Status: None   Collection Time: 07/03/18 12:00 AM  Result Value Ref Range   TSH 2.47 0.41 - 5.90    Comment: free t4 1.82     Assessment & Plan:   1. 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 Graves' disease-  Resolved  -Her previsit thyroid function tests are consistent with slight over replacement.  I discussed and lowered her levothyroxine to 75 mcg p.o.  every morning before breakfast..     - We discussed about the correct intake of her thyroid hormone, on empty stomach at fasting, with water, separated by at least 30 minutes from breakfast and other medications,  and separated by more than 4 hours from calcium, iron, multivitamins, acid reflux medications (PPIs). -Patient is made aware of the fact that thyroid hormone replacement is needed for life, dose to be  adjusted by periodic monitoring of thyroid function tests.   - I advised patient to maintain close follow up with Remigio Eisenmenger, MD for her diabetes and primary care needs.  Follow up plan: Return in about 6 months (around 01/10/2019) for Follow up with Pre-visit Labs.  Marquis Lunch, MD Phone: [phone]  Fax: [phone]   This note was partially dictated with voice recognition software. Similar sounding words can be transcribed inadequately or may not  be corrected upon review.  07/10/2018, 4:59 PM

## 2018-09-02 ENCOUNTER — Other Ambulatory Visit: Payer: Self-pay | Admitting: "Endocrinology

## 2018-12-18 ENCOUNTER — Other Ambulatory Visit: Payer: Self-pay | Admitting: "Endocrinology

## 2019-01-03 LAB — TSH: TSH: 7.92 — AB (ref <=5.90)

## 2019-01-10 ENCOUNTER — Ambulatory Visit (INDEPENDENT_AMBULATORY_CARE_PROVIDER_SITE_OTHER): Payer: Medicare HMO | Admitting: "Endocrinology

## 2019-01-10 ENCOUNTER — Encounter: Payer: Self-pay | Admitting: "Endocrinology

## 2019-01-10 ENCOUNTER — Other Ambulatory Visit: Payer: Self-pay

## 2019-01-10 ENCOUNTER — Other Ambulatory Visit: Payer: Self-pay | Admitting: "Endocrinology

## 2019-01-10 DIAGNOSIS — E890 Postprocedural hypothyroidism: Secondary | ICD-10-CM

## 2019-01-10 MED ORDER — LEVOTHYROXINE SODIUM 88 MCG PO TABS: 88.0000 ug | ORAL_TABLET | Freq: Every day | ORAL | 6 refills | Status: DC

## 2019-01-10 NOTE — Progress Notes (Signed)
01/10/2019                                Endocrinology Telehealth Visit Follow up Note -During COVID -19 Pandemic  I connected with Kristi Estrada on 01/10/2019   by telephone and verified that I am speaking with the correct person using two identifiers. Kristi Estrada, 04/11/1951. she has verbally consented to this visit. All issues noted in this document were discussed and addressed. The format was not optimal for physical exam.   Subjective:    Patient ID: Kristi Estrada, female    DOB: [birth year], PCP Lanier Clam, MD.   Past Medical History:  Diagnosis Date  . Diabetes mellitus type I ([place])   . Hyperthyroidism    History reviewed. No pertinent surgical history. Social History   Socioeconomic History  . Marital status: Married    Spouse name: Not on file  . Number of children: Not on file  . Years of education: Not on file  . Highest education level: Not on file  Occupational History  . Not on file  Social Needs  . Financial resource strain: Not on file  . Food insecurity    Worry: Not on file    Inability: Not on file  . Transportation needs    Medical: Not on file    Non-medical: Not on file  Tobacco Use  . Smoking status: Never Smoker  . Smokeless tobacco: Never Used  Substance and Sexual Activity  . Alcohol use: No    Alcohol/week: 0.0 standard drinks  . Drug use: No  . Sexual activity: Not on file  Lifestyle  . Physical activity    Days per week: Not on file    Minutes per session: Not on file  . Stress: Not on file  Relationships  . Social Herbalist on phone: Not on file    Gets together: Not on file    Attends religious service: Not on file    Active member of club or organization: Not on file    Attends meetings of clubs or organizations: Not on file    Relationship status: Not on file  Other Topics Concern  . Not on file  Social History Narrative  . Not on file   Outpatient Encounter Medications as of 01/10/2019  Medication Sig  .  alendronate (FOSAMAX) 35 MG tablet TAKE 1 TAB BY MOUTH ONCE A WEEK  . aspirin EC 81 MG tablet Take by mouth.  . B-D ULTRAFINE III SHORT PEN 31G X 8 MM MISC USE SUBCUTANEOUSLY 4 TIMES A DAY  . cholecalciferol (VITAMIN D) 1000 units tablet Take 1,000 Units by mouth daily.  . hydrochlorothiazide (HYDRODIURIL) 25 MG tablet Take 25 mg by mouth daily.  Marland Kitchen LEVEMIR FLEXTOUCH 100 UNIT/ML Pen INJECT 25 UNITS SUBCUTANEOUSLY IN THE MORNING AND 15 UNITS AT NIGHT  . levothyroxine (SYNTHROID) 88 MCG tablet Take 1 tablet (88 mcg total) by mouth daily before breakfast.  . losartan (COZAAR) 50 MG tablet Take by mouth.  . mirtazapine (REMERON) 15 MG tablet at bedtime.  . Multiple Vitamin (MULTIVITAMIN) tablet Take 1 tablet by mouth daily.  Marland Kitchen NOVOLOG FLEXPEN 100 UNIT/ML FlexPen INJECT 10 UNITS SUBCUTANEOUSLY THREE TIMES A DAY WITH MEALS  . pravastatin (PRAVACHOL) 20 MG tablet   . [DISCONTINUED] levothyroxine (SYNTHROID) 75 MCG tablet TAKE 1 TABLET BY MOUTH EVERY DAY BEFORE BREAKFAST   No facility-administered encounter medications on file as of  01/10/2019.    ALLERGIES: Allergies  Allergen Reactions  . Metformin Nausea Only   VACCINATION STATUS:  There is no immunization history on file for this patient.  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  - She is currently on levothyroxine 75 mcg p.o. daily before breakfast.  She reports compliance to her medication.  She continues to feel better, has no new complaints today.    -She has a steady weight since last visit. -24-hour thyroid uptake was uniform at 50.8% prior to treatment.  The patient has family history of thyroid dysfunction  in her mother and in one of her daughters. - She has diabetes type unidentified on insulin treatment.  She was diagnosed 15 years ago at age 68. She is currently on Levemir 27 units in the morning and 15 units at bedtime along with  NovoLog 10 units 3 times a day before meals. Her most recent A1c was reported at 7.9%, she wishes to continue care with PMD for diabetes.  Review of Systems Limited as above.   Objective:    There were no vitals taken for this visit.  Wt Readings from Last 3 Encounters:  07/10/18 171 lb 3.2 oz (77.7 kg)  04/10/18 170 lb (77.1 kg)  10/09/17 152 lb (68.9 kg)      [facility]: Right lobe 5.3 cm x 1.6 cm x 1.9 cm heterogenous and appearance. Left lobe 4.8 cm x 1.9 cm x 1.4 cm heterogenous with a 3.7 mm solid/hypoechoic nodule.  -  On 09/21/2016: Thyroid uptake and scan showed 50.8% of uniform uptake consistent with Graves' disease. Recent Results (from the past 2160 hour(s))  TSH     Status: Abnormal   Collection Time: 01/03/19 12:00 AM  Result Value Ref Range   TSH 7.92 (A) 0.41 - 5.90    Comment: Free T4 - 1.47     Assessment & Plan:   1. 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 Graves' disease-  Resolved  -Her previsit thyroid function tests are consistent with inadequate replacement.  She is approached for slight increase in her levothyroxine to 88 mcg p.o. daily before breakfast.     - We discussed about the correct intake of her thyroid hormone, on empty stomach at fasting, with water, separated by at least 30 minutes from breakfast and other medications,  and separated by more than 4 hours from calcium, iron, multivitamins, acid reflux medications (PPIs). -Patient is made aware of the fact that thyroid hormone replacement is needed for life, dose to be adjusted by periodic monitoring of thyroid function tests.  - I advised patient to maintain close follow up with Remigio EisenmengerBalakrishnan, Shyam E, MD for her diabetes and primary care needs.  Time for this visit: 15 minutes. Tawana ScaleBetty Bells  participated in the discussions, expressed understanding, and voiced agreement with the above plans.  All questions were answered to  her satisfaction. she is encouraged to contact clinic should she have any questions or concerns prior to her return visit.  Follow up plan: Return in about 6 months (around 07/10/2019) for Follow up with Pre-visit Labs.  Marquis LunchGebre Austyn Perriello, MD Phone: [phone]  Fax: [phone]   This note was partially dictated with voice recognition software. Similar sounding words can be transcribed inadequately or may not  be corrected upon review.  01/10/2019, 4:58 PM

## 2019-02-11 ENCOUNTER — Other Ambulatory Visit: Payer: Self-pay

## 2019-02-11 MED ORDER — LEVOTHYROXINE SODIUM 88 MCG PO TABS: 88.0000 ug | ORAL_TABLET | Freq: Every day | ORAL | 1 refills | Status: DC

## 2019-05-28 ENCOUNTER — Other Ambulatory Visit: Payer: Self-pay | Admitting: "Endocrinology

## 2019-07-09 LAB — TSH: TSH: 6.9 — AB (ref 0.41–5.90)

## 2019-07-15 ENCOUNTER — Ambulatory Visit: Payer: Medicare HMO | Admitting: "Endocrinology

## 2019-07-16 ENCOUNTER — Ambulatory Visit (INDEPENDENT_AMBULATORY_CARE_PROVIDER_SITE_OTHER): Payer: Medicare HMO | Admitting: "Endocrinology

## 2019-07-16 ENCOUNTER — Encounter: Payer: Self-pay | Admitting: "Endocrinology

## 2019-07-16 DIAGNOSIS — E890 Postprocedural hypothyroidism: Secondary | ICD-10-CM

## 2019-07-16 MED ORDER — LEVOTHYROXINE SODIUM 88 MCG PO TABS: ORAL_TABLET | ORAL | 1 refills | Status: DC

## 2019-07-16 NOTE — Progress Notes (Signed)
07/16/2019                                         Endocrinology Telehealth Visit Follow up Note -During COVID -19 Pandemic  I connected with Kristi Estrada on 07/16/2019   by telephone and verified that I am speaking with the correct person using two identifiers. Kristi Estrada, 1950-07-27. she has verbally consented to this visit. All issues noted in this document were discussed and addressed. The format was not optimal for physical exam.               Subjective:    Patient ID: Kristi Estrada, female    DOB: March 04, 1951, PCP Christena Flake, MD.   Past Medical History:  Diagnosis Date  . Diabetes mellitus type I (HCC)   . Hyperthyroidism    History reviewed. No pertinent surgical history. Social History   Socioeconomic History  . Marital status: Married    Spouse name: Not on file  . Number of children: Not on file  . Years of education: Not on file  . Highest education level: Not on file  Occupational History  . Not on file  Tobacco Use  . Smoking status: Never Smoker  . Smokeless tobacco: Never Used  Substance and Sexual Activity  . Alcohol use: No    Alcohol/week: 0.0 standard drinks  . Drug use: No  . Sexual activity: Not on file  Other Topics Concern  . Not on file  Social History Narrative  . Not on file   Social Determinants of Health   Financial Resource Strain:   . Difficulty of Paying Living Expenses: Not on file  Food Insecurity:   . Worried About Programme researcher, broadcasting/film/video in the Last Year: Not on file  . Ran Out of Food in the Last Year: Not on file  Transportation Needs:   . Lack of Transportation (Medical): Not on file  . Lack of Transportation (Non-Medical): Not on file  Physical Activity:   . Days of Exercise per Week: Not on file  . Minutes of Exercise per Session: Not on file  Stress:   . Feeling of Stress : Not on file  Social Connections:   . Frequency of Communication with Friends and Family: Not on file  . Frequency of Social Gatherings with Friends  and Family: Not on file  . Attends Religious Services: Not on file  . Active Member of Clubs or Organizations: Not on file  . Attends Banker Meetings: Not on file  . Marital Status: Not on file   Outpatient Encounter Medications as of 07/16/2019  Medication Sig  . alendronate (FOSAMAX) 35 MG tablet TAKE 1 TAB BY MOUTH ONCE A WEEK  . aspirin EC 81 MG tablet Take by mouth.  . B-D ULTRAFINE III SHORT PEN 31G X 8 MM MISC USE SUBCUTANEOUSLY 4 TIMES A DAY  . cholecalciferol (VITAMIN D) 1000 units tablet Take 1,000 Units by mouth daily.  . hydrochlorothiazide (HYDRODIURIL) 25 MG tablet Take 25 mg by mouth daily.  Marland Kitchen LEVEMIR FLEXTOUCH 100 UNIT/ML Pen INJECT 25 UNITS SUBCUTANEOUSLY IN THE MORNING AND 15 UNITS AT NIGHT  . levothyroxine (SYNTHROID) 88 MCG tablet TAKE 1 TABLET BY MOUTH DAILY BEFORE BREAKFAST  . losartan (COZAAR) 50 MG tablet Take by mouth.  . mirtazapine (REMERON) 15 MG tablet at bedtime.  . Multiple Vitamin (MULTIVITAMIN) tablet Take 1 tablet by mouth  daily.  Marland Kitchen NOVOLOG FLEXPEN 100 UNIT/ML FlexPen INJECT 10 UNITS SUBCUTANEOUSLY THREE TIMES A DAY WITH MEALS  . pravastatin (PRAVACHOL) 20 MG tablet   . [DISCONTINUED] levothyroxine (SYNTHROID) 88 MCG tablet TAKE 1 TABLET BY MOUTH DAILY BEFORE BREAKFAST   No facility-administered encounter medications on file as of 07/16/2019.   ALLERGIES: Allergies  Allergen Reactions  . Metformin Nausea Only   VACCINATION STATUS:  There is no immunization history on file for this patient.  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  - She was supposed to be on levothyroxine 88 mcg daily, however reportedly her levothyroxine was lowered back to 75 mcg by her PMD.  Her previsit thyroid function tests are consistent with under replacement.       -She has a steady weight since last visit.  The patient has family history of thyroid dysfunction   in her mother and in one of her daughters. - She has diabetes type unidentified on insulin treatment.  She was diagnosed 15 years ago at age 37. She is currently on Levemir 27 units in the morning and 15 units at bedtime along with NovoLog 10 units 3 times a day before meals. Her most recent A1c was reported at 7.9%, she wishes to continue care with PMD for diabetes.  Review of Systems Limited as above.   Objective:    There were no vitals taken for this visit.  Wt Readings from Last 3 Encounters:  07/10/18 171 lb 3.2 oz (77.7 kg)  04/10/18 170 lb (77.1 kg)  10/09/17 152 lb (68.9 kg)      Thyroid ultrasound from 11/16/2015 in [facility] showed: Right lobe 5.3 cm x 1.6 cm x 1.9 cm heterogenous and appearance. Left lobe 4.8 cm x 1.9 cm x 1.4 cm heterogenous with a 3.7 mm solid/hypoechoic nodule.  -  On 09/21/2016: Thyroid uptake and scan showed 50.8% of uniform uptake consistent with Graves' disease. Recent Results (from the past 2160 hour(s))  TSH     Status: Abnormal   Collection Time: 07/09/19 12:00 AM  Result Value Ref Range   TSH 6.90 (A) 0.41 - 5.90    Comment: free t4 1.59     Assessment & Plan:   1. 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 Graves' disease-  Resolved  -Her previsit thyroid function tests are consistent with inadequate replacement.  She will benefit from her original dose of levothyroxine, 88 mcg p.o. daily before breakfast.    - We discussed about the correct intake of her thyroid hormone, on empty stomach at fasting, with water, separated by at least 30 minutes from breakfast and other medications,  and separated by more than 4 hours from calcium, iron, multivitamins, acid reflux medications (PPIs). -Patient is made aware of the fact that thyroid hormone replacement is needed for life, dose to be adjusted by periodic monitoring of thyroid function tests.  Graves' disease has resolved.  She has type 1 diabetes on basal/bolus insulin,  recent A1c of 7.9%.  She prefers to continue follow-up with her PMD for diabetes.  - I advised patient to maintain close follow up with Olena Mater, MD for her diabetes and primary care needs.     - Time spent on this patient care encounter:  25 minutes of which 50% was spent in  counseling and the rest reviewing  her current and  previous labs / studies and medications  doses and developing a plan for long term care. Chea Malan  participated in the discussions, expressed understanding, and voiced agreement with the above plans.  All questions were answered to her satisfaction. she is encouraged to contact clinic should she have any questions or concerns prior to her return visit.  Follow up plan: Return in about 4 months (around 11/15/2019) for Follow up with Pre-visit Labs.  Marquis Lunch, MD Phone: [phone]  Fax: [phone]   This note was partially dictated with voice recognition software. Similar sounding words can be transcribed inadequately or may not  be corrected upon review.  07/16/2019, 9:42 PM

## 2019-09-04 ENCOUNTER — Telehealth: Payer: Self-pay | Admitting: "Endocrinology

## 2019-09-04 ENCOUNTER — Other Ambulatory Visit: Payer: Self-pay | Admitting: "Endocrinology

## 2019-09-04 DIAGNOSIS — E890 Postprocedural hypothyroidism: Secondary | ICD-10-CM

## 2019-09-04 NOTE — Telephone Encounter (Signed)
Mailed lab order.

## 2019-09-04 NOTE — Telephone Encounter (Signed)
Pt has lost over 20 pounds, feels so tired and can not keep losing weight. She said she feels the same way she feels when she first found out about graves disease.

## 2019-09-04 NOTE — Telephone Encounter (Signed)
I want her to go to the lab for thyroid function test to see if she needs adjustment in her dose. TSH/Free T4

## 2019-09-04 NOTE — Telephone Encounter (Signed)
Left vm to return my call

## 2019-09-09 LAB — TSH: TSH: 4.74 (ref 0.41–5.90)

## 2019-10-14 ENCOUNTER — Telehealth: Payer: Self-pay | Admitting: "Endocrinology

## 2019-10-14 DIAGNOSIS — E890 Postprocedural hypothyroidism: Secondary | ICD-10-CM

## 2019-10-14 MED ORDER — LEVOTHYROXINE SODIUM 88 MCG PO TABS: ORAL_TABLET | ORAL | 0 refills | Status: DC

## 2019-10-14 NOTE — Telephone Encounter (Signed)
Rx refill sent in for levothyroxine daily per Dr.Nida. Pt made aware.

## 2019-10-14 NOTE — Telephone Encounter (Signed)
Left a message requesting a return call to the office. 

## 2019-10-14 NOTE — Telephone Encounter (Signed)
Patient calling and states she picked up her refill of levothyroxine (SYNTHROID) 88 MCG tablet  At the pharmacy and they gave her 75 instead of 88. She is wanting to know if there was a change made.  [phone]

## 2019-10-15 ENCOUNTER — Other Ambulatory Visit: Payer: Self-pay | Admitting: "Endocrinology

## 2019-11-18 ENCOUNTER — Other Ambulatory Visit: Payer: Self-pay

## 2019-11-18 ENCOUNTER — Encounter: Payer: Self-pay | Admitting: "Endocrinology

## 2019-11-18 ENCOUNTER — Ambulatory Visit (INDEPENDENT_AMBULATORY_CARE_PROVIDER_SITE_OTHER): Payer: Medicare HMO | Admitting: "Endocrinology

## 2019-11-18 VITALS — BP 131/77 | HR 85 | Ht 71.0 in | Wt 155.4 lb

## 2019-11-18 DIAGNOSIS — E890 Postprocedural hypothyroidism: Secondary | ICD-10-CM

## 2019-11-18 DIAGNOSIS — E1165 Type 2 diabetes mellitus with hyperglycemia: Secondary | ICD-10-CM | POA: Insufficient documentation

## 2019-11-18 NOTE — Progress Notes (Signed)
11/18/2019                   Endocrinology follow-up note   Subjective:    Patient ID: Kristi Estrada, female    DOB: 12/15/1950, PCP Christena Flake, MD.   Past Medical History:  Diagnosis Date  . Diabetes mellitus type I (HCC)   . Hyperthyroidism    History reviewed. No pertinent surgical history. Social History   Socioeconomic History  . Marital status: Married    Spouse name: Not on file  . Number of children: Not on file  . Years of education: Not on file  . Highest education level: Not on file  Occupational History  . Not on file  Tobacco Use  . Smoking status: Never Smoker  . Smokeless tobacco: Never Used  Vaping Use  . Vaping Use: Never used  Substance and Sexual Activity  . Alcohol use: No    Alcohol/week: 0.0 standard drinks  . Drug use: No  . Sexual activity: Not on file  Other Topics Concern  . Not on file  Social History Narrative  . Not on file   Social Determinants of Health   Financial Resource Strain:   . Difficulty of Paying Living Expenses:   Food Insecurity:   . Worried About Programme researcher, broadcasting/film/video in the Last Year:   . Barista in the Last Year:   Transportation Needs:   . Freight forwarder (Medical):   Marland Kitchen Lack of Transportation (Non-Medical):   Physical Activity:   . Days of Exercise per Week:   . Minutes of Exercise per Session:   Stress:   . Feeling of Stress :   Social Connections:   . Frequency of Communication with Friends and Family:   . Frequency of Social Gatherings with Friends and Family:   . Attends Religious Services:   . Active Member of Clubs or Organizations:   . Attends Banker Meetings:   Marland Kitchen Marital Status:    Outpatient Encounter Medications as of 11/18/2019  Medication Sig  . alendronate (FOSAMAX) 35 MG tablet TAKE 1 TAB BY MOUTH ONCE A WEEK  . aspirin EC 81 MG tablet Take by mouth.  . B-D ULTRAFINE III SHORT PEN 31G X 8 MM MISC USE SUBCUTANEOUSLY 4 TIMES A DAY  . cholecalciferol (VITAMIN D)  1000 units tablet Take 1,000 Units by mouth daily.  Marland Kitchen escitalopram (LEXAPRO) 10 MG tablet Take 10 mg by mouth daily.  . hydrochlorothiazide (HYDRODIURIL) 25 MG tablet Take 25 mg by mouth daily.  Marland Kitchen LEVEMIR FLEXTOUCH 100 UNIT/ML Pen Inject 70 Units into the skin at bedtime.  Marland Kitchen levothyroxine (SYNTHROID) 88 MCG tablet TAKE 1 TABLET BY MOUTH DAILY BEFORE BREAKFAST  . losartan (COZAAR) 50 MG tablet Take by mouth.  . mirtazapine (REMERON) 15 MG tablet at bedtime.  . Multiple Vitamin (MULTIVITAMIN) tablet Take 1 tablet by mouth daily.  Marland Kitchen NOVOLOG FLEXPEN 100 UNIT/ML FlexPen INJECT 10 UNITS SUBCUTANEOUSLY THREE TIMES A DAY WITH MEALS  . pravastatin (PRAVACHOL) 20 MG tablet    No facility-administered encounter medications on file as of 11/18/2019.   ALLERGIES: Allergies  Allergen Reactions  . Metformin Nausea Only   VACCINATION STATUS: Immunization History  Administered Date(s) Administered  . 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  - She was supposed to be on levothyroxine 88 mcg  p.o. daily before breakfast, reports good compliance.  Her previsit thyroid function tests are consistent with appropriate replacement.     -She has lost 15 pounds since last visit.  .  The patient has family history of thyroid dysfunction  in her mother and in one of her daughters. - She has diabetes type unidentified on insulin treatment.  She was diagnosed 15 years ago at age 76. She is currently on Levemir 70 units in the morning and 15 units in the evening.   -She has not seen her PMD for diabetes lately.  She called log sheet showing tightly controlled fasting glycemic profile, hyperglycemia postprandially.   Review of Systems Limited as above.   Objective:    BP 131/77   Pulse 85   Ht 5\' 11"  (1.803 m)   Wt 155 lb 6.4 oz (70.5 kg)   BMI 21.67  kg/m   Wt Readings from Last 3 Encounters:  11/18/19 155 lb 6.4 oz (70.5 kg)  07/10/18 171 lb 3.2 oz (77.7 kg)  04/10/18 170 lb (77.1 kg)      Thyroid ultrasound from 11/16/2015 in [facility] showed: Right lobe 5.3 cm x 1.6 cm x 1.9 cm heterogenous and appearance. Left lobe 4.8 cm x 1.9 cm x 1.4 cm heterogenous with a 3.7 mm solid/hypoechoic nodule.  -  On 09/21/2016: Thyroid uptake and scan showed 50.8% of uniform uptake consistent with Graves' disease. Recent Results (from the past 2160 hour(s))  TSH     Status: None   Collection Time: 09/09/19 12:00 AM  Result Value Ref Range   TSH 4.74 0.41 - 5.90    Comment: Free T4=1.64     Assessment & Plan:   1. 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 Graves' disease-  Resolved  -Her previsit thyroid function tests are consistent with appropriate replacement.  She is advised to continue levothyroxine, 88 mcg p.o. daily before breakfast.    - We discussed about the correct intake of her thyroid hormone, on empty stomach at fasting, with water, separated by at least 30 minutes from breakfast and other medications,  and separated by more than 4 hours from calcium, iron, multivitamins, acid reflux medications (PPIs). -Patient is made aware of the fact that thyroid hormone replacement is needed for life, dose to be adjusted by periodic monitoring of thyroid function tests.  Graves' disease has resolved.  She has type ? diabetes on basal/bolus insulin, no recent A1c.  She reports that she is struggling to control glycemia.  She does not have a near future appointment with her PMD.  She is advised to shift her Levemir to nightly at 70 units, discontinue her evening Levemir.  She is willing to monitor her blood glucose twice a day-daily before breakfast and before going to bed.   -She is encouraged to call clinic for blood glucose readings less than 70 or greater than 200 mg per DL x3.  - I advised patient to maintain close  follow up with 11/09/19, MD for her diabetes and primary care needs.      - Time spent on this patient care encounter:  20 minutes of which 50% was spent in  counseling and the rest reviewing  her current and  previous labs / studies and medications  doses and developing a plan for long term care. Meryl Hubers  participated in the discussions, expressed understanding, and voiced agreement with the above plans.  All questions were answered to her satisfaction. she is encouraged to contact  clinic should she have any questions or concerns prior to her return visit.  Follow up plan: Return in about 3 months (around 02/18/2020) for Bring Meter and Logs- A1c in Office.  Marquis Lunch, MD Phone: [phone]  Fax: [phone]   This note was partially dictated with voice recognition software. Similar sounding words can be transcribed inadequately or may not  be corrected upon review.  11/18/2019, 1:11 PM

## 2019-11-18 NOTE — Patient Instructions (Signed)

## 2019-11-20 ENCOUNTER — Telehealth: Payer: Self-pay | Admitting: "Endocrinology

## 2019-11-20 NOTE — Telephone Encounter (Signed)
We need to know how her readings are today. I want her to start monitoring BG 4 times a day, before meals and at bed time. She can increase her Levemir to 80 units qhs if she still runs higher than 180 mg/dl .

## 2019-11-20 NOTE — Telephone Encounter (Signed)
Left voice message for pt to advise, advised to call us to get appt scheduled.

## 2019-11-20 NOTE — Telephone Encounter (Signed)
Per patient BG was 143 this am, she isn't sure what all they gave her when she was in ED last night, just checked it now prior to lunch and it was 289.

## 2019-11-20 NOTE — Telephone Encounter (Signed)
I want her to start monitoring BG 4 times a day, before meals and at bed time. She can increase her Levemir to 80 units qhs, and move up her appt for diabetes in 10 days . Inform her to bring her meter and logs.

## 2019-11-20 NOTE — Telephone Encounter (Signed)
I spoke with patient, She stated that last night was her first time taking Levemir 70units qhs, when she went to do her hs blood glucose her meter alerted her it was above 600, attempted to check a couple more times and it was the same, went to the ED and pt wants to know what she should do.

## 2019-11-20 NOTE — Telephone Encounter (Signed)
Pt is calling and states at her appointment on 7/12 Dr. Fransico Him changed her insulin to 70 units at bedtime. Patient tried it last night and states she checked her sugar and it read warning extreme high, patient went to ER and her sugar was 720. Pt requesting call back from nurse [phone]

## 2019-11-21 NOTE — Telephone Encounter (Signed)
Spoke with pt, she is scheduled for 7/29 @2pm , pt verbalized understanding.

## 2019-11-24 ENCOUNTER — Other Ambulatory Visit: Payer: Self-pay | Admitting: "Endocrinology

## 2019-12-02 ENCOUNTER — Other Ambulatory Visit: Payer: Self-pay | Admitting: "Endocrinology

## 2019-12-05 ENCOUNTER — Encounter: Payer: Self-pay | Admitting: "Endocrinology

## 2019-12-05 ENCOUNTER — Other Ambulatory Visit: Payer: Self-pay

## 2019-12-05 ENCOUNTER — Ambulatory Visit (INDEPENDENT_AMBULATORY_CARE_PROVIDER_SITE_OTHER): Payer: Medicare HMO | Admitting: "Endocrinology

## 2019-12-05 VITALS — BP 110/72 | HR 80 | Ht 71.0 in | Wt 149.8 lb

## 2019-12-05 DIAGNOSIS — E1165 Type 2 diabetes mellitus with hyperglycemia: Secondary | ICD-10-CM

## 2019-12-05 DIAGNOSIS — E890 Postprocedural hypothyroidism: Secondary | ICD-10-CM | POA: Diagnosis not present

## 2019-12-05 MED ORDER — NOVOLOG FLEXPEN 100 UNIT/ML ~~LOC~~ SOPN: 8.0000 [IU] | PEN_INJECTOR | Freq: Three times a day (TID) | SUBCUTANEOUS | 2 refills | Status: DC

## 2019-12-05 NOTE — Progress Notes (Signed)
12/05/2019                  Endocrinology follow-up note   Subjective:    Patient ID: Kristi Estrada, female    DOB: December 06, 1950, PCP Christena Flake, MD.   Past Medical History:  Diagnosis Date  . Diabetes mellitus type I (HCC)   . Hyperthyroidism    History reviewed. No pertinent surgical history. Social History   Socioeconomic History  . Marital status: Married    Spouse name: Not on file  . Number of children: Not on file  . Years of education: Not on file  . Highest education level: Not on file  Occupational History  . Not on file  Tobacco Use  . Smoking status: Never Smoker  . Smokeless tobacco: Never Used  Vaping Use  . Vaping Use: Never used  Substance and Sexual Activity  . Alcohol use: No    Alcohol/week: 0.0 standard drinks  . Drug use: No  . Sexual activity: Not on file  Other Topics Concern  . Not on file  Social History Narrative  . Not on file   Social Determinants of Health   Financial Resource Strain:   . Difficulty of Paying Living Expenses:   Food Insecurity:   . Worried About Programme researcher, broadcasting/film/video in the Last Year:   . Barista in the Last Year:   Transportation Needs:   . Freight forwarder (Medical):   Marland Kitchen Lack of Transportation (Non-Medical):   Physical Activity:   . Days of Exercise per Week:   . Minutes of Exercise per Session:   Stress:   . Feeling of Stress :   Social Connections:   . Frequency of Communication with Friends and Family:   . Frequency of Social Gatherings with Friends and Family:   . Attends Religious Services:   . Active Member of Clubs or Organizations:   . Attends Banker Meetings:   Marland Kitchen Marital Status:    Outpatient Encounter Medications as of 12/05/2019  Medication Sig  . alendronate (FOSAMAX) 35 MG tablet TAKE 1 TAB BY MOUTH ONCE A WEEK  . aspirin EC 81 MG tablet Take by mouth.  . B-D ULTRAFINE III SHORT PEN 31G X 8 MM MISC USE SUBCUTANEOUSLY 4 TIMES A DAY  . cholecalciferol (VITAMIN D)  1000 units tablet Take 1,000 Units by mouth daily.  Marland Kitchen escitalopram (LEXAPRO) 10 MG tablet Take 10 mg by mouth daily.  . hydrochlorothiazide (HYDRODIURIL) 25 MG tablet Take 25 mg by mouth daily.  . insulin aspart (NOVOLOG FLEXPEN) 100 UNIT/ML FlexPen Inject 8-14 Units into the skin 3 (three) times daily with meals.  Marland Kitchen LEVEMIR FLEXTOUCH 100 UNIT/ML Pen Inject 30 Units into the skin at bedtime.  Marland Kitchen levothyroxine (SYNTHROID) 88 MCG tablet TAKE 1 TABLET BY MOUTH DAILY BEFORE BREAKFAST  . losartan (COZAAR) 50 MG tablet Take by mouth.  . mirtazapine (REMERON) 15 MG tablet at bedtime.  . Multiple Vitamin (MULTIVITAMIN) tablet Take 1 tablet by mouth daily.  . pravastatin (PRAVACHOL) 20 MG tablet   . [DISCONTINUED] NOVOLOG FLEXPEN 100 UNIT/ML FlexPen INJECT 10 UNITS SUBCUTANEOUSLY THREE TIMES A DAY WITH MEALS   No facility-administered encounter medications on file as of 12/05/2019.   ALLERGIES: Allergies  Allergen Reactions  . Metformin Nausea Only   VACCINATION STATUS: Immunization History  Administered Date(s) Administered  . Moderna SARS-COVID-2 Vaccination 07/10/2019, 08/07/2019    HPI  - She is  a 69 year old female status post  RAI induced  hypothyroidism related to her treatment for Graves' disease on 10/07/2016. -She was recently seen and kept on levothyroxine 88 mcg p.o. daily before breakfast.  She reports compliance to this medication and her she has no new complaints. -This visit is for her diabetes.  She was advised to consolidate her twice daily Levemir to 70 units nightly and start monitoring blood glucose 4 times a day.  She presents with her log and  Meter, showing significantly fluctuating glycemic profile including morning hypoglycemia.  She was diagnosed with diabetes at approximate age of 51. -She has been following with her PMD, until she lost control. - She has diabetes type unidentified on insulin treatment.    -Her last A1c measurements are between 9.1% and 9.6%. -She  recently lost about 20 pounds, unintentionally.    The patient has family history of thyroid dysfunction  in her mother and in one of her daughters.  Review of Systems Limited as above.   Objective:    BP 110/72   Pulse 80   Ht 5\' 11"  (1.803 m)   Wt 149 lb 12.8 oz (67.9 kg)   BMI 20.89 kg/m   Wt Readings from Last 3 Encounters:  12/05/19 149 lb 12.8 oz (67.9 kg)  11/18/19 155 lb 6.4 oz (70.5 kg)  07/10/18 171 lb 3.2 oz (77.7 kg)      Thyroid ultrasound from 11/16/2015 in [facility] showed: Right lobe 5.3 cm x 1.6 cm x 1.9 cm heterogenous and appearance. Left lobe 4.8 cm x 1.9 cm x 1.4 cm heterogenous with a 3.7 mm solid/hypoechoic nodule.  -  On 09/21/2016: Thyroid uptake and scan showed 50.8% of uniform uptake consistent with Graves' disease. Recent Results (from the past 2160 hour(s))  TSH     Status: None   Collection Time: 09/09/19 12:00 AM  Result Value Ref Range   TSH 4.74 0.41 - 5.90    Comment: Free T4=1.64     Assessment & Plan:   1. 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 Graves' disease-  Resolved  -Her previsit thyroid function tests are consistent with appropriate replacement.  She is advised to continue levothyroxine, 88 mcg p.o. daily before breakfast.    - We discussed about the correct intake of her thyroid hormone, on empty stomach at fasting, with water, separated by at least 30 minutes from breakfast and other medications,  and separated by more than 4 hours from calcium, iron, multivitamins, acid reflux medications (PPIs). -Patient is made aware of the fact that thyroid hormone replacement is needed for life, dose to be adjusted by periodic monitoring of thyroid function tests.   Graves' disease has resolved.    She has type ? diabetes on basal, recent A1c is 9.6%. -She will need intensive treatment with basal/bolus insulin in order for her to achieve control of diabetes target.    -Due to her fasting hypoglycemia, she is  advised to lower her Levemir to 30 units nightly, discussed and initiated NovoLog 8-14 units 3 times daily AC, associated with strict monitoring of blood glucose 4 times a day-before meals and at bedtime.    - she  admits there is a room for improvement in her diet and drink choices. -  Suggestion is made for her to avoid simple carbohydrates  from her diet including Cakes, Sweet Desserts / Pastries, Ice Cream, Soda (diet and regular), Sweet Tea, Candies, Chips, Cookies, Sweet Pastries,  Store Bought Juices, Alcohol in Excess of  1-2 drinks a day, Artificial Sweeteners, Coffee  Creamer, and "Sugar-free" Products. This will help patient to have stable blood glucose profile and potentially avoid unintended weight gain.  -She is encouraged to call clinic for hypoglycemia below 70 or greater than 200x3.  - I advised patient to maintain close follow up with Christena Flake, MD for her diabetes and primary care needs.      - Time spent on this patient care encounter:  35 minutes of which 50% was spent in  counseling and the rest reviewing  her current and  previous labs / studies and medications  doses and developing a plan for long term care. Lam Bjorklund  participated in the discussions, expressed understanding, and voiced agreement with the above plans.  All questions were answered to her satisfaction. she is encouraged to contact clinic should she have any questions or concerns prior to her return visit.   Follow up plan: Return in about 5 weeks (around 01/09/2020) for Bring Meter and Logs- A1c in Office.  Marquis Lunch, MD Phone: [phone]  Fax: [phone]   This note was partially dictated with voice recognition software. Similar sounding words can be transcribed inadequately or may not  be corrected upon review.  12/05/2019, 3:06 PM

## 2019-12-09 ENCOUNTER — Telehealth: Payer: Self-pay | Admitting: "Endocrinology

## 2019-12-09 MED ORDER — NOVOLOG FLEXPEN 100 UNIT/ML ~~LOC~~ SOPN: 8.0000 [IU] | PEN_INJECTOR | Freq: Three times a day (TID) | SUBCUTANEOUS | 2 refills | Status: DC

## 2019-12-09 NOTE — Telephone Encounter (Signed)
Rx sent 

## 2019-12-09 NOTE — Telephone Encounter (Signed)
Pt states her pharmacy is out of her medicine and would like it sent to the pharmacy below.  insulin aspart (NOVOLOG FLEXPEN) 100 UNIT/ML FlexPen   [id number] - [place], [place] - [address] Phone:  [phone]  Fax:  [phone]

## 2019-12-29 ENCOUNTER — Other Ambulatory Visit: Payer: Self-pay | Admitting: "Endocrinology

## 2020-01-09 ENCOUNTER — Encounter: Payer: Self-pay | Admitting: "Endocrinology

## 2020-01-09 ENCOUNTER — Other Ambulatory Visit: Payer: Self-pay

## 2020-01-09 ENCOUNTER — Ambulatory Visit: Payer: Medicare HMO | Admitting: "Endocrinology

## 2020-01-09 VITALS — BP 125/78 | HR 92 | Ht 71.0 in | Wt 156.2 lb

## 2020-01-09 DIAGNOSIS — E1065 Type 1 diabetes mellitus with hyperglycemia: Secondary | ICD-10-CM | POA: Diagnosis not present

## 2020-01-09 DIAGNOSIS — E890 Postprocedural hypothyroidism: Secondary | ICD-10-CM

## 2020-01-09 LAB — POCT GLYCOSYLATED HEMOGLOBIN (HGB A1C): Hemoglobin A1C: 8.2 % — AB (ref 4.0–5.6)

## 2020-01-09 MED ORDER — FREESTYLE LIBRE 2 SENSOR MISC: 1.0000 | 3 refills | Status: DC

## 2020-01-09 MED ORDER — FREESTYLE LIBRE 2 READER DEVI: 0 refills | Status: DC

## 2020-01-09 NOTE — Patient Instructions (Signed)

## 2020-01-09 NOTE — Progress Notes (Signed)
01/09/2020                  Endocrinology follow-up note   Subjective:    Patient ID: Kristi Estrada, female    DOB: 23-Feb-1951, PCP Christena Flake, MD.   Past Medical History:  Diagnosis Date  . Diabetes mellitus type I (HCC)   . Hyperthyroidism    History reviewed. No pertinent surgical history. Social History   Socioeconomic History  . Marital status: Married    Spouse name: Not on file  . Number of children: Not on file  . Years of education: Not on file  . Highest education level: Not on file  Occupational History  . Not on file  Tobacco Use  . Smoking status: Never Smoker  . Smokeless tobacco: Never Used  Vaping Use  . Vaping Use: Never used  Substance and Sexual Activity  . Alcohol use: No    Alcohol/week: 0.0 standard drinks  . Drug use: No  . Sexual activity: Not on file  Other Topics Concern  . Not on file  Social History Narrative  . Not on file   Social Determinants of Health   Financial Resource Strain:   . Difficulty of Paying Living Expenses: Not on file  Food Insecurity:   . Worried About Programme researcher, broadcasting/film/video in the Last Year: Not on file  . Ran Out of Food in the Last Year: Not on file  Transportation Needs:   . Lack of Transportation (Medical): Not on file  . Lack of Transportation (Non-Medical): Not on file  Physical Activity:   . Days of Exercise per Week: Not on file  . Minutes of Exercise per Session: Not on file  Stress:   . Feeling of Stress : Not on file  Social Connections:   . Frequency of Communication with Friends and Family: Not on file  . Frequency of Social Gatherings with Friends and Family: Not on file  . Attends Religious Services: Not on file  . Active Member of Clubs or Organizations: Not on file  . Attends Banker Meetings: Not on file  . Marital Status: Not on file   Outpatient Encounter Medications as of 01/09/2020  Medication Sig  . alendronate (FOSAMAX) 35 MG tablet TAKE 1 TAB BY MOUTH ONCE A WEEK  .  aspirin EC 81 MG tablet Take by mouth.  . B-D ULTRAFINE III SHORT PEN 31G X 8 MM MISC USE SUBCUTANEOUSLY 4 TIMES A DAY  . cholecalciferol (VITAMIN D) 1000 units tablet Take 1,000 Units by mouth daily.  . Continuous Blood Gluc Receiver (FREESTYLE LIBRE 2 READER) DEVI As directed  . Continuous Blood Gluc Sensor (FREESTYLE LIBRE 2 SENSOR) MISC 1 Piece by Does not apply route every 14 (fourteen) days.  Marland Kitchen escitalopram (LEXAPRO) 10 MG tablet Take 10 mg by mouth daily.  . hydrochlorothiazide (HYDRODIURIL) 25 MG tablet Take 25 mg by mouth daily.  . insulin aspart (NOVOLOG FLEXPEN) 100 UNIT/ML FlexPen Inject 8-14 Units into the skin 3 (three) times daily with meals.  Marland Kitchen LEVEMIR FLEXTOUCH 100 UNIT/ML Pen Inject 25 Units into the skin at bedtime.  Marland Kitchen levothyroxine (SYNTHROID) 88 MCG tablet TAKE 1 TABLET BY MOUTH DAILY BEFORE BREAKFAST  . losartan (COZAAR) 50 MG tablet Take by mouth.  . mirtazapine (REMERON) 15 MG tablet at bedtime.  . Multiple Vitamin (MULTIVITAMIN) tablet Take 1 tablet by mouth daily.  . pravastatin (PRAVACHOL) 20 MG tablet    No facility-administered encounter medications on file as of 01/09/2020.  ALLERGIES: Allergies  Allergen Reactions  . Metformin Nausea Only   VACCINATION STATUS: Immunization History  Administered Date(s) Administered  . 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 -She was recently seen and kept on levothyroxine 88 mcg p.o. daily before breakfast.  She reports compliance to this medication and her she has no new complaints. -This visit is for follow-up of her diabetes care.  During her last visit, she was reinitiated on basal/bolus insulin using Levemir 30 units nightly, NovoLog 8 units 3 times daily AC.  She presents with significantly improved glycemic profile and point-of-care A1c is 8.2% improving from  9.6%.  She did have rare, random, mild hypoglycemia at fasting.  She has gained 7 pounds since last visit.  She feels better.  She has more energy to do what she enjoys doing.     She was diagnosed with diabetes at approximate age of 60. -She has been following with her PMD, until she lost control.   -Before this visit, she lost about 20 pounds, unintentionally.    The patient has family history of thyroid dysfunction  in her mother and in one of her daughters.  Review of Systems Limited as above.   Objective:    BP 125/78   Pulse 92   Ht 5\' 11"  (1.803 m)   Wt 156 lb 3.2 oz (70.9 kg)   BMI 21.79 kg/m   Wt Readings from Last 3 Encounters:  01/09/20 156 lb 3.2 oz (70.9 kg)  12/05/19 149 lb 12.8 oz (67.9 kg)  11/18/19 155 lb 6.4 oz (70.5 kg)      Thyroid ultrasound from 11/16/2015 in [facility] showed: Right lobe 5.3 cm x 1.6 cm x 1.9 cm heterogenous and appearance. Left lobe 4.8 cm x 1.9 cm x 1.4 cm heterogenous with a 3.7 mm solid/hypoechoic nodule.  -  On 09/21/2016: Thyroid uptake and scan showed 50.8% of uniform uptake consistent with Graves' disease. Recent Results (from the past 2160 hour(s))  HgB A1c     Status: Abnormal   Collection Time: 01/09/20  2:05 PM  Result Value Ref Range   Hemoglobin A1C 8.2 (A) 4.0 - 5.6 %   HbA1c POC (<> result, manual entry)     HbA1c, POC (prediabetic range)     HbA1c, POC (controlled diabetic range)       Assessment & Plan:   1. 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 Graves' disease-  Resolved  -Her previsit thyroid function tests are consistent with appropriate replacement.  She is advised to continue levothyroxine, 88 mcg p.o. daily before breakfast.    - We discussed about the correct intake of her thyroid hormone, on empty stomach at fasting, with water, separated by at least 30 minutes from breakfast and other medications,  and separated by more than 4 hours from calcium, iron, multivitamins, acid reflux  medications (PPIs). -Patient is made aware of the fact that thyroid hormone replacement is needed for life, dose to be adjusted by periodic monitoring of thyroid function tests.  Graves' disease has resolved.    She has type ? diabetes on basal/bolus insulin.  She returns with significantly improved glycemic profile and point-of-care A1c of 8.2% improving from 9.6%. -She will need more work-up to classify her diabetes properly. -She will need intensive treatment with basal/bolus insulin in order for her to achieve control of diabetes  target.    -Due to her fasting hypoglycemia, she is advised to lower her Levemir to 25 units nightly, continue NovoLog 8-11 units 3 times daily AC, associated with strict monitoring of blood glucose 4 times a day-before meals and at bedtime.  She would benefit from a CGM.  I discussed and ordered the freestyle libre device for her.  - she  admits there is a room for improvement in her diet and drink choices. -  Suggestion is made for her to avoid simple carbohydrates  from her diet including Cakes, Sweet Desserts / Pastries, Ice Cream, Soda (diet and regular), Sweet Tea, Candies, Chips, Cookies, Sweet Pastries,  Store Bought Juices, Alcohol in Excess of  1-2 drinks a day, Artificial Sweeteners, Coffee Creamer, and "Sugar-free" Products. This will help patient to have stable blood glucose profile and potentially avoid unintended weight gain.   -She is encouraged to call clinic for hypoglycemia below 70 or greater than 200x3.  - I advised patient to maintain close follow up with Christena Flake, MD for her diabetes and primary care needs.       - Time spent on this patient care encounter:  30 minutes of which 50% was spent in  counseling and the rest reviewing  her current and  previous labs / studies and medications  doses and developing a plan for long term care. Vici Novick  participated in the discussions, expressed understanding, and voiced agreement with the  above plans.  All questions were answered to her satisfaction. she is encouraged to contact clinic should she have any questions or concerns prior to her return visit.   Follow up plan: Return in about 3 months (around 04/09/2020) for F/U with Pre-visit Labs, Meter, Logs, A1c here.Marquis Lunch, MD Phone: [phone]  Fax: [phone]   This note was partially dictated with voice recognition software. Similar sounding words can be transcribed inadequately or may not  be corrected upon review.  01/09/2020, 3:53 PM

## 2020-02-11 ENCOUNTER — Other Ambulatory Visit: Payer: Self-pay

## 2020-02-11 ENCOUNTER — Telehealth: Payer: Self-pay | Admitting: "Endocrinology

## 2020-02-11 MED ORDER — LEVEMIR FLEXTOUCH 100 UNIT/ML ~~LOC~~ SOPN: 25.0000 [IU] | PEN_INJECTOR | Freq: Every day | SUBCUTANEOUS | 1 refills | Status: DC

## 2020-02-11 NOTE — Telephone Encounter (Signed)
Pt requesting a refill.  LEVEMIR FLEXTOUCH 100 UNIT/ML Pen    [facility] - [place], [place] - [address] Phone:  [phone]  Fax:  [phone]

## 2020-02-11 NOTE — Telephone Encounter (Signed)
Sent in

## 2020-02-18 ENCOUNTER — Ambulatory Visit: Payer: Medicare HMO | Admitting: "Endocrinology

## 2020-04-01 ENCOUNTER — Other Ambulatory Visit: Payer: Self-pay | Admitting: "Endocrinology

## 2020-04-01 DIAGNOSIS — E890 Postprocedural hypothyroidism: Secondary | ICD-10-CM

## 2020-04-03 LAB — COMPREHENSIVE METABOLIC PANEL
Calcium: 8.7 (ref 8.7–10.7)
GFR calc Af Amer: 83
GFR calc non Af Amer: 72

## 2020-04-03 LAB — BASIC METABOLIC PANEL
BUN: 18 (ref 4–21)
Creatinine: 0.8 (ref 0.5–1.1)

## 2020-04-03 LAB — TSH: TSH: 3.29 (ref 0.41–5.90)

## 2020-04-09 ENCOUNTER — Encounter: Payer: Self-pay | Admitting: "Endocrinology

## 2020-04-09 ENCOUNTER — Other Ambulatory Visit: Payer: Self-pay

## 2020-04-09 ENCOUNTER — Ambulatory Visit (INDEPENDENT_AMBULATORY_CARE_PROVIDER_SITE_OTHER): Payer: Medicare HMO | Admitting: "Endocrinology

## 2020-04-09 VITALS — BP 110/66 | HR 80 | Ht 71.0 in | Wt 165.5 lb

## 2020-04-09 DIAGNOSIS — E890 Postprocedural hypothyroidism: Secondary | ICD-10-CM | POA: Diagnosis not present

## 2020-04-09 DIAGNOSIS — E1065 Type 1 diabetes mellitus with hyperglycemia: Secondary | ICD-10-CM

## 2020-04-09 LAB — POCT GLYCOSYLATED HEMOGLOBIN (HGB A1C): HbA1c, POC (controlled diabetic range): 8.5 % — AB (ref 0.0–7.0)

## 2020-04-09 NOTE — Progress Notes (Signed)
04/09/2020                  Endocrinology follow-up note  Subjective:    Patient ID: Kristi Estrada, female    DOB: 1951-01-28, PCP Kristi Flake, MD.   Past Medical History:  Diagnosis Date  . Diabetes mellitus type I (HCC)   . Hyperthyroidism    No past surgical history on file. Social History   Socioeconomic History  . Marital status: Married    Spouse name: Not on file  . Number of children: Not on file  . Years of education: Not on file  . Highest education level: Not on file  Occupational History  . Not on file  Tobacco Use  . Smoking status: Never Smoker  . Smokeless tobacco: Never Used  Vaping Use  . Vaping Use: Never used  Substance and Sexual Activity  . Alcohol use: No    Alcohol/week: 0.0 standard drinks  . Drug use: No  . Sexual activity: Not on file  Other Topics Concern  . Not on file  Social History Narrative  . Not on file   Social Determinants of Health   Financial Resource Strain:   . Difficulty of Paying Living Expenses: Not on file  Food Insecurity:   . Worried About Programme researcher, broadcasting/film/video in the Last Year: Not on file  . Ran Out of Food in the Last Year: Not on file  Transportation Needs:   . Lack of Transportation (Medical): Not on file  . Lack of Transportation (Non-Medical): Not on file  Physical Activity:   . Days of Exercise per Week: Not on file  . Minutes of Exercise per Session: Not on file  Stress:   . Feeling of Stress : Not on file  Social Connections:   . Frequency of Communication with Friends and Family: Not on file  . Frequency of Social Gatherings with Friends and Family: Not on file  . Attends Religious Services: Not on file  . Active Member of Clubs or Organizations: Not on file  . Attends Banker Meetings: Not on file  . Marital Status: Not on file   Outpatient Encounter Medications as of 04/09/2020  Medication Sig  . alendronate (FOSAMAX) 35 MG tablet TAKE 1 TAB BY MOUTH ONCE A WEEK  . aspirin EC 81 MG  tablet Take by mouth.  . B-D ULTRAFINE III SHORT PEN 31G X 8 MM MISC USE SUBCUTANEOUSLY 4 TIMES A DAY  . cholecalciferol (VITAMIN D) 1000 units tablet Take 1,000 Units by mouth daily.  . Continuous Blood Gluc Receiver (FREESTYLE LIBRE 2 READER) DEVI As directed  . Continuous Blood Gluc Sensor (FREESTYLE LIBRE 2 SENSOR) MISC 1 Piece by Does not apply route every 14 (fourteen) days.  Marland Kitchen escitalopram (LEXAPRO) 10 MG tablet Take 10 mg by mouth daily.  . hydrochlorothiazide (HYDRODIURIL) 25 MG tablet Take 25 mg by mouth daily.  . insulin aspart (NOVOLOG FLEXPEN) 100 UNIT/ML FlexPen Inject 8-14 Units into the skin 3 (three) times daily with meals.  Marland Kitchen LEVEMIR FLEXTOUCH 100 UNIT/ML FlexPen Inject 25 Units into the skin at bedtime.  Marland Kitchen levothyroxine (SYNTHROID) 88 MCG tablet TAKE 1 TABLET BY MOUTH EVERY DAY BEFORE BREAKFAST  . losartan (COZAAR) 50 MG tablet Take by mouth.  . mirtazapine (REMERON) 15 MG tablet at bedtime.  . Multiple Vitamin (MULTIVITAMIN) tablet Take 1 tablet by mouth daily.  . pravastatin (PRAVACHOL) 20 MG tablet    No facility-administered encounter medications on file as of 04/09/2020.  ALLERGIES: Allergies  Allergen Reactions  . Metformin Nausea Only   VACCINATION STATUS: Immunization History  Administered Date(s) Administered  . 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 -She is currently on levothyroxine 88 mcg p.o. daily before breakfast.  She reports compliance to her medications and has no new complaints. . She also has chronically uncontrolled diabetes.  During her recent visit she was put on basal/bolus insulin for better control.  She documented near target glycemic profile, A1c of 8.5% overall improving from 9.6%.  However, she did not improve her glycemic profile since last visit.  She continued to feel better,  regaining her weight.   She was diagnosed with diabetes at approximate age of 37.  Work-up is underway to classify her diabetes properly. -She has been following with her PMD, until she lost control.  The patient has family history of thyroid dysfunction  in her mother and in one of her daughters.  Review of Systems Limited as above.   Objective:    BP 110/66   Pulse 80   Ht 5\' 11"  (1.803 m)   Wt 165 lb 8 oz (75.1 kg)   BMI 23.08 kg/m   Wt Readings from Last 3 Encounters:  04/09/20 165 lb 8 oz (75.1 kg)  01/09/20 156 lb 3.2 oz (70.9 kg)  12/05/19 149 lb 12.8 oz (67.9 kg)      Thyroid ultrasound from 11/16/2015 in [facility] showed: Right lobe 5.3 cm x 1.6 cm x 1.9 cm heterogenous and appearance. Left lobe 4.8 cm x 1.9 cm x 1.4 cm heterogenous with a 3.7 mm solid/hypoechoic nodule.  -  On 09/21/2016: Thyroid uptake and scan showed 50.8% of uniform uptake consistent with Graves' disease. Recent Results (from the past 2160 hour(s))  Basic metabolic panel     Status: None   Collection Time: 04/03/20 12:00 AM  Result Value Ref Range   BUN 18 4 - 21   Creatinine 0.8 0.5 - 1.1  Comprehensive metabolic panel     Status: None   Collection Time: 04/03/20 12:00 AM  Result Value Ref Range   GFR calc Af Amer 83    GFR calc non Af Amer 72    Calcium 8.7 8.7 - 10.7  TSH     Status: None   Collection Time: 04/03/20 12:00 AM  Result Value Ref Range   TSH 3.29 0.41 - 5.90    Comment: FREE T4- 1.14  HgB A1c     Status: Abnormal   Collection Time: 04/09/20  2:06 PM  Result Value Ref Range   Hemoglobin A1C     HbA1c POC (<> result, manual entry)     HbA1c, POC (prediabetic range)     HbA1c, POC (controlled diabetic range) 8.5 (A) 0.0 - 7.0 %     Assessment & Plan:   1. 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 Graves' disease-  Resolved  -Her previsit thyroid function tests are consistent with appropriate replacement.  She is advised to continue  levothyroxine 88 mcg p.o. daily before breakfast.     - We discussed about the correct intake of her thyroid hormone, on empty stomach at fasting, with water, separated by at least 30 minutes from breakfast and other medications,  and separated by more than 4 hours from calcium, iron, multivitamins, acid reflux medications (PPIs). -Patient is made aware of  the fact that thyroid hormone replacement is needed for life, dose to be adjusted by periodic monitoring of thyroid function tests.    Graves' disease has resolved.    She has type ? diabetes on basal/bolus insulin.  She returns with significantly fluctuating glycemic profile and A1c of 8.5%.    -She will need more work-up to classify her diabetes properly. -She will need intensive treatment with basal/bolus insulin in order for her to achieve control of diabetes target.    -Due to her fasting hypoglycemia, she is advised to avoid adjusting her Levemir, and keep it consistent at 25 units nightly,  continue NovoLog 8-14 units 3 times daily AC, associated with strict monitoring of blood glucose 4 times a day-before meals and at bedtime.   She has received her CGM device, will be helped with the first unit today.    - she  admits there is a room for improvement in her diet and drink choices. -  Suggestion is made for her to avoid simple carbohydrates  from her diet including Cakes, Sweet Desserts / Pastries, Ice Cream, Soda (diet and regular), Sweet Tea, Candies, Chips, Cookies, Sweet Pastries,  Store Bought Juices, Alcohol in Excess of  1-2 drinks a day, Artificial Sweeteners, Coffee Creamer, and "Sugar-free" Products. This will help patient to have stable blood glucose profile and potentially avoid unintended weight gain.    -She is encouraged to call clinic for hypoglycemia below 70 or greater than 200x3.  - I advised patient to maintain close follow up with Kristi Flake, MD for her diabetes and primary care needs.      - Time spent  on this patient care encounter:  30 minutes of which 50% was spent in  counseling and the rest reviewing  her current and  previous glycemic profile,  labs / studies and medications  doses and developing a plan for long term care. Kristi Estrada  participated in the discussions, expressed understanding, and voiced agreement with the above plans.  All questions were answered to her satisfaction. she is encouraged to contact clinic should she have any questions or concerns prior to her return visit.    Follow up plan: No follow-ups on file.  Marquis Lunch, MD Phone: [phone]  Fax: [phone]   This note was partially dictated with voice recognition software. Similar sounding words can be transcribed inadequately or may not  be corrected upon review.  04/09/2020, 2:40 PM

## 2020-04-10 ENCOUNTER — Other Ambulatory Visit: Payer: Self-pay | Admitting: "Endocrinology

## 2020-04-10 DIAGNOSIS — E890 Postprocedural hypothyroidism: Secondary | ICD-10-CM

## 2020-04-21 ENCOUNTER — Other Ambulatory Visit: Payer: Self-pay | Admitting: "Endocrinology

## 2020-05-08 ENCOUNTER — Other Ambulatory Visit: Payer: Self-pay | Admitting: "Endocrinology

## 2020-05-22 ENCOUNTER — Other Ambulatory Visit: Payer: Self-pay | Admitting: "Endocrinology

## 2020-05-25 ENCOUNTER — Other Ambulatory Visit: Payer: Self-pay | Admitting: "Endocrinology

## 2020-05-26 ENCOUNTER — Other Ambulatory Visit: Payer: Self-pay | Admitting: "Endocrinology

## 2020-06-19 ENCOUNTER — Telehealth: Payer: Self-pay | Admitting: "Endocrinology

## 2020-06-19 ENCOUNTER — Other Ambulatory Visit: Payer: Self-pay

## 2020-06-19 DIAGNOSIS — E1065 Type 1 diabetes mellitus with hyperglycemia: Secondary | ICD-10-CM

## 2020-06-19 MED ORDER — BD PEN NEEDLE SHORT U/F 31G X 8 MM MISC: 3 refills | Status: DC

## 2020-06-19 NOTE — Telephone Encounter (Signed)
Pt is calling and requesting Pen Needles be sent in   [facility] - [place], [place] - [address] Phone:  [phone]  Fax:  [phone]

## 2020-06-19 NOTE — Telephone Encounter (Signed)
Sent in

## 2020-07-03 ENCOUNTER — Other Ambulatory Visit: Payer: Self-pay | Admitting: "Endocrinology

## 2020-07-23 ENCOUNTER — Other Ambulatory Visit: Payer: Self-pay | Admitting: "Endocrinology

## 2020-07-23 DIAGNOSIS — E890 Postprocedural hypothyroidism: Secondary | ICD-10-CM

## 2020-08-04 LAB — TSH: TSH: 0.66 (ref 0.41–5.90)

## 2020-08-10 ENCOUNTER — Ambulatory Visit: Payer: Medicare HMO | Admitting: "Endocrinology

## 2020-08-17 ENCOUNTER — Ambulatory Visit: Payer: Medicare HMO | Admitting: "Endocrinology

## 2020-08-19 ENCOUNTER — Other Ambulatory Visit: Payer: Self-pay | Admitting: "Endocrinology

## 2020-08-26 ENCOUNTER — Other Ambulatory Visit: Payer: Self-pay

## 2020-08-26 ENCOUNTER — Encounter: Payer: Self-pay | Admitting: "Endocrinology

## 2020-08-26 ENCOUNTER — Ambulatory Visit: Payer: Medicare HMO | Admitting: "Endocrinology

## 2020-08-26 VITALS — BP 146/88 | HR 72 | Ht 71.0 in | Wt 179.4 lb

## 2020-08-26 DIAGNOSIS — E890 Postprocedural hypothyroidism: Secondary | ICD-10-CM | POA: Diagnosis not present

## 2020-08-26 DIAGNOSIS — I10 Essential (primary) hypertension: Secondary | ICD-10-CM

## 2020-08-26 DIAGNOSIS — E1065 Type 1 diabetes mellitus with hyperglycemia: Secondary | ICD-10-CM

## 2020-08-26 LAB — POCT GLYCOSYLATED HEMOGLOBIN (HGB A1C): HbA1c, POC (controlled diabetic range): 7.9 % — AB (ref 0.0–7.0)

## 2020-08-26 MED ORDER — NOVOLOG FLEXPEN RELION 100 UNIT/ML ~~LOC~~ SOPN: 10.0000 [IU] | PEN_INJECTOR | Freq: Three times a day (TID) | SUBCUTANEOUS | 1 refills | Status: DC

## 2020-08-26 MED ORDER — LEVEMIR FLEXTOUCH 100 UNIT/ML ~~LOC~~ SOPN: PEN_INJECTOR | SUBCUTANEOUS | 2 refills | Status: DC

## 2020-08-26 NOTE — Patient Instructions (Signed)

## 2020-08-26 NOTE — Progress Notes (Signed)
08/26/2020                  Endocrinology follow-up note  Subjective:    Patient ID: Kristi Estrada, female    DOB: 1951/05/05, PCP Christena Flake, MD.   Past Medical History:  Diagnosis Date  . Diabetes mellitus type I (HCC)   . Hyperthyroidism    History reviewed. No pertinent surgical history. Social History   Socioeconomic History  . Marital status: Married    Spouse name: Not on file  . Number of children: Not on file  . Years of education: Not on file  . Highest education level: Not on file  Occupational History  . Not on file  Tobacco Use  . Smoking status: Never Smoker  . Smokeless tobacco: Never Used  Vaping Use  . Vaping Use: Never used  Substance and Sexual Activity  . Alcohol use: No    Alcohol/week: 0.0 standard drinks  . Drug use: No  . Sexual activity: Not on file  Other Topics Concern  . Not on file  Social History Narrative  . Not on file   Social Determinants of Health   Financial Resource Strain: Not on file  Food Insecurity: Not on file  Transportation Needs: Not on file  Physical Activity: Not on file  Stress: Not on file  Social Connections: Not on file   Outpatient Encounter Medications as of 08/26/2020  Medication Sig  . alendronate (FOSAMAX) 35 MG tablet TAKE 1 TAB BY MOUTH ONCE A WEEK  . aspirin EC 81 MG tablet Take by mouth.  . B-D ULTRAFINE III SHORT PEN 31G X 8 MM MISC USE SUBCUTANEOUSLY 4 TIMES A DAY  . cholecalciferol (VITAMIN D) 1000 units tablet Take 1,000 Units by mouth daily.  . Continuous Blood Gluc Receiver (FREESTYLE LIBRE 2 READER) DEVI As directed  . Continuous Blood Gluc Sensor (FREESTYLE LIBRE 2 SENSOR) MISC 1 Piece by Does not apply route every 14 (fourteen) days.  Marland Kitchen escitalopram (LEXAPRO) 10 MG tablet Take 10 mg by mouth daily.  . hydrochlorothiazide (HYDRODIURIL) 25 MG tablet Take 25 mg by mouth daily.  Marland Kitchen LEVEMIR FLEXTOUCH 100 UNIT/ML FlexPen INJECT 25 UNITS SUBCUTANEOUSLY AT BEDTIME  . levothyroxine (SYNTHROID) 88  MCG tablet TAKE 1 TABLET BY MOUTH EVERY DAY BEFORE BREAKFAST  . losartan (COZAAR) 50 MG tablet Take by mouth.  . mirtazapine (REMERON) 15 MG tablet at bedtime.  . Multiple Vitamin (MULTIVITAMIN) tablet Take 1 tablet by mouth daily.  Marland Kitchen NOVOLOG FLEXPEN RELION 100 UNIT/ML FlexPen Inject 10-13 Units into the skin 3 (three) times daily before meals.  . pravastatin (PRAVACHOL) 20 MG tablet   . [DISCONTINUED] LEVEMIR FLEXTOUCH 100 UNIT/ML FlexPen INJECT 25 UNITS SUBCUTANEOUSLY AT BEDTIME  . [DISCONTINUED] NOVOLOG FLEXPEN RELION 100 UNIT/ML FlexPen INJECT 8 TO 14 UNITS SUBCUTANEOUSLY THREE TIMES DAILY WITH MEALS   No facility-administered encounter medications on file as of 08/26/2020.   ALLERGIES: Allergies  Allergen Reactions  . Metformin Nausea Only   VACCINATION STATUS: Immunization History  Administered Date(s) Administered  . 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 -She is currently on levothyroxine 88 mcg p.o. daily before breakfast.  She reports compliance to her medications and has no new complaints. Her previsit thyroid function tests are consistent with appropriate replacement. .-She is also being followed for chronically uncontrolled diabetes now confirmed to be type  1.   During her recent visit she was put on basal/bolus insulin for better control.  She documented near target glycemic profile, point-of-care A1c of 7.9% significantly improving overall from 9.6%.  She wears freestyle libre CGM.  Printout shows 45% time range, 50 to 3% above range, 2% rare/random hypoglycemia.     She continued to feel better, regaining her weight.  She continues to struggle with social stress taking care of her disabled daughter.   She was diagnosed with diabetes at approximate age of 68.    The patient has family history of thyroid  dysfunction  in her mother and in one of her daughters.  Review of Systems Limited as above.   Objective:    BP (!) 146/88   Pulse 72   Ht 5\' 11"  (1.803 m)   Wt 179 lb 6.4 oz (81.4 kg)   BMI 25.02 kg/m   Wt Readings from Last 3 Encounters:  08/26/20 179 lb 6.4 oz (81.4 kg)  04/09/20 165 lb 8 oz (75.1 kg)  01/09/20 156 lb 3.2 oz (70.9 kg)      Thyroid ultrasound from 11/16/2015 in [facility] showed: Right lobe 5.3 cm x 1.6 cm x 1.9 cm heterogenous and appearance. Left lobe 4.8 cm x 1.9 cm x 1.4 cm heterogenous with a 3.7 mm solid/hypoechoic nodule.  -  On 09/21/2016: Thyroid uptake and scan showed 50.8% of uniform uptake consistent with Graves' disease. Recent Results (from the past 2160 hour(s))  TSH     Status: None   Collection Time: 08/04/20 12:00 AM  Result Value Ref Range   TSH 0.66 0.41 - 5.90    Comment: Free T4 1.26  HgB A1c     Status: Abnormal   Collection Time: 08/26/20 11:22 AM  Result Value Ref Range   Hemoglobin A1C     HbA1c POC (<> result, manual entry)     HbA1c, POC (prediabetic range)     HbA1c, POC (controlled diabetic range) 7.9 (A) 0.0 - 7.0 %     Assessment & Plan:   1. 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 Graves' disease-  Resolved  -Her previsit thyroid function tests are consistent with appropriate replacement.  She is advised to continue levothyroxine 88 mcg p.o. daily before breakfast.    - We discussed about the correct intake of her thyroid hormone, on empty stomach at fasting, with water, separated by at least 30 minutes from breakfast and other medications,  and separated by more than 4 hours from calcium, iron, multivitamins, acid reflux medications (PPIs). -Patient is made aware of the fact that thyroid hormone replacement is needed for life, dose to be adjusted by periodic monitoring of thyroid function tests.    Graves' disease has resolved.   3.  Type 1 diabetes Her diabetes is now confirmed to be  type I due to high levels of antilipid therapy and antiglutamic acid decarboxylase antibodies.   She is responding to basal insulin with point-of-care A1c of 7.9%, improving from 9.6%.  -She will continue to need intensive treatment with basal/bolus insulin in order for her to achieve control of diabetes target.    -She is advised to continue Levemir 25 units nightly, increase NovoLog to 10-13 units 3 times daily AC, for Premeal blood glucose readings above 90 mg per DL, associated with strict monitoring of blood glucose 4 times a day-before meals and at bedtime.  -She has received supplies for CGM, encouraged to continue to use her CGM device at all  times. - she acknowledges that there is a room for improvement in her food and drink choices. - Suggestion is made for her to avoid simple carbohydrates  from her diet including Cakes, Sweet Desserts, Ice Cream, Soda (diet and regular), Sweet Tea, Candies, Chips, Cookies, Store Bought Juices, Alcohol in Excess of  1-2 drinks a day, Artificial Sweeteners,  Coffee Creamer, and "Sugar-free" Products, Lemonade. This will help patient to have more stable blood glucose profile and potentially avoid unintended weight gain.  -She is encouraged to call clinic for hypoglycemia below 70 or greater than 200x3.   4.  Hypertension: Her blood pressure is slightly above target.  She is advised to continue losartan 50 mg p.o. daily, hydrochlorothiazide 25 mg p.o. daily.  She is advised on salt restrictions. - I advised patient to maintain close follow up with Christena Flake, MD for her diabetes and primary care needs.    I spent 41 minutes in the care of the patient today including review of labs from CMP, Lipids, Thyroid Function, Hematology (current and previous including abstractions from other facilities); face-to-face time discussing  her blood glucose readings/logs, discussing hypoglycemia and hyperglycemia episodes and symptoms, medications doses, her options  of short and long term treatment based on the latest standards of care / guidelines;  discussion about incorporating lifestyle medicine;  and documenting the encounter.    Please refer to Patient Instructions for Blood Glucose Monitoring and Insulin/Medications Dosing Guide"  in media tab for additional information. Please  also refer to " Patient Self Inventory" in the Media  tab for reviewed elements of pertinent patient history.  Kristi Estrada participated in the discussions, expressed understanding, and voiced agreement with the above plans.  All questions were answered to her satisfaction. she is encouraged to contact clinic should she have any questions or concerns prior to her return visit.   Follow up plan: Return in about 4 months (around 12/26/2020) for F/U with Pre-visit Labs, Meter, Logs, A1c here.Kristi Lunch, MD Phone: [phone]  Fax: [phone]   This note was partially dictated with voice recognition software. Similar sounding words can be transcribed inadequately or may not  be corrected upon review.  08/26/2020, 1:14 PM

## 2020-09-02 ENCOUNTER — Other Ambulatory Visit: Payer: Self-pay | Admitting: "Endocrinology

## 2020-11-18 ENCOUNTER — Other Ambulatory Visit: Payer: Self-pay | Admitting: "Endocrinology

## 2020-12-15 ENCOUNTER — Other Ambulatory Visit: Payer: Self-pay

## 2020-12-15 MED ORDER — FREESTYLE LIBRE 2 SENSOR MISC: 1.0000 | 3 refills | Status: DC

## 2020-12-29 ENCOUNTER — Encounter: Payer: Self-pay | Admitting: "Endocrinology

## 2020-12-29 ENCOUNTER — Ambulatory Visit (INDEPENDENT_AMBULATORY_CARE_PROVIDER_SITE_OTHER): Payer: Medicare HMO | Admitting: "Endocrinology

## 2020-12-29 ENCOUNTER — Other Ambulatory Visit: Payer: Self-pay

## 2020-12-29 VITALS — BP 150/78 | HR 60 | Ht 71.0 in | Wt 184.0 lb

## 2020-12-29 DIAGNOSIS — E890 Postprocedural hypothyroidism: Secondary | ICD-10-CM | POA: Diagnosis not present

## 2020-12-29 DIAGNOSIS — I10 Essential (primary) hypertension: Secondary | ICD-10-CM

## 2020-12-29 DIAGNOSIS — E1065 Type 1 diabetes mellitus with hyperglycemia: Secondary | ICD-10-CM

## 2020-12-29 LAB — POCT GLYCOSYLATED HEMOGLOBIN (HGB A1C): HbA1c, POC (controlled diabetic range): 8 % — AB (ref 0.0–7.0)

## 2020-12-29 MED ORDER — FREESTYLE LIBRE 2 SENSOR MISC: 1.0000 | 3 refills | Status: DC

## 2020-12-29 MED ORDER — NOVOLOG FLEXPEN RELION 100 UNIT/ML ~~LOC~~ SOPN: 12.0000 [IU] | PEN_INJECTOR | Freq: Three times a day (TID) | SUBCUTANEOUS | 0 refills | Status: DC

## 2020-12-29 NOTE — Progress Notes (Signed)
12/29/2020                  Endocrinology follow-up note  Subjective:    Patient ID: Kristi Estrada, female    DOB: 19-Jun-1950, PCP Christena Flake, MD.   Past Medical History:  Diagnosis Date   Diabetes mellitus type I (HCC)    Hyperthyroidism    History reviewed. No pertinent surgical history. Social History   Socioeconomic History   Marital status: Married    Spouse name: Not on file   Number of children: Not on file   Years of education: Not on file   Highest education level: Not on file  Occupational History   Not on file  Tobacco Use   Smoking status: Never   Smokeless tobacco: Never  Vaping Use   Vaping Use: Never used  Substance and Sexual Activity   Alcohol use: No    Alcohol/week: 0.0 standard drinks   Drug use: No   Sexual activity: Not on file  Other Topics Concern   Not on file  Social History Narrative   Not on file   Social Determinants of Health   Financial Resource Strain: Not on file  Food Insecurity: Not on file  Transportation Needs: Not on file  Physical Activity: Not on file  Stress: Not on file  Social Connections: Not on file   Outpatient Encounter Medications as of 12/29/2020  Medication Sig   alendronate (FOSAMAX) 35 MG tablet TAKE 1 TAB BY MOUTH ONCE A WEEK   aspirin EC 81 MG tablet Take by mouth.   B-D ULTRAFINE III SHORT PEN 31G X 8 MM MISC USE SUBCUTANEOUSLY 4 TIMES A DAY   cholecalciferol (VITAMIN D) 1000 units tablet Take 1,000 Units by mouth daily.   Continuous Blood Gluc Receiver (FREESTYLE LIBRE 2 READER) DEVI As directed   Continuous Blood Gluc Sensor (FREESTYLE LIBRE 2 SENSOR) MISC 1 Piece by Does not apply route every 14 (fourteen) days.   escitalopram (LEXAPRO) 10 MG tablet Take 10 mg by mouth daily.   hydrochlorothiazide (HYDRODIURIL) 25 MG tablet Take 25 mg by mouth daily.   LEVEMIR FLEXTOUCH 100 UNIT/ML FlexPen INJECT 25 UNITS SUBCUTANEOUSLY AT BEDTIME   levothyroxine (SYNTHROID) 88 MCG tablet TAKE 1 TABLET BY MOUTH  EVERY DAY BEFORE BREAKFAST   losartan (COZAAR) 50 MG tablet Take by mouth.   mirtazapine (REMERON) 15 MG tablet at bedtime.   Multiple Vitamin (MULTIVITAMIN) tablet Take 1 tablet by mouth daily.   NOVOLOG FLEXPEN 100 UNIT/ML FlexPen Inject 12-15 Units into the skin 3 (three) times daily with meals.   pravastatin (PRAVACHOL) 20 MG tablet    [DISCONTINUED] Continuous Blood Gluc Sensor (FREESTYLE LIBRE 2 SENSOR) MISC 1 Piece by Does not apply route every 14 (fourteen) days.   [DISCONTINUED] NOVOLOG FLEXPEN RELION 100 UNIT/ML FlexPen INJECT 10 TO 13 UNITS SUBCUTANEOUSLY THREE TIMES DAILY WITH MEALS   No facility-administered encounter medications on file as of 12/29/2020.   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 -She is currently on levothyroxine 88 mcg p.o. daily before breakfast.  She reports compliance to her medications and has no new complaints. Her previsit thyroid function tests are consistent with appropriate replacement. .-She is also being followed for chronically uncontrolled  diabetes now confirmed to be type 1.   During her recent visit she was put on basal/bolus insulin for better control.  Her CGM device shows 46% time range, 46% above range, no major hypoglycemia.  Her point-of-care A1c is higher at 8%, although improving from 9.6%.   She continued to feel better, regaining her weight.  She continues to struggle with social stress taking care of her disabled daughter.   She was diagnosed with diabetes at approximate age of 35.    The patient has family history of thyroid dysfunction  in her mother and in one of her daughters.  Review of Systems Limited as  above.   Objective:    BP (!) 150/78   Pulse 60   Ht 5\' 11"  (1.803 m)   Wt 184 lb (83.5 kg)   BMI 25.66 kg/m   Wt Readings from Last 3 Encounters:  12/29/20 184 lb (83.5 kg)  08/26/20 179 lb 6.4 oz (81.4 kg)  04/09/20 165 lb 8 oz (75.1 kg)      [facility] showed: Right lobe 5.3 cm x 1.6 cm x 1.9 cm heterogenous and appearance. Left lobe 4.8 cm x 1.9 cm x 1.4 cm heterogenous with a 3.7 mm solid/hypoechoic nodule.  -  On 09/21/2016: Thyroid uptake and scan showed 50.8% of uniform uptake consistent with Graves' disease. Recent Results (from the past 2160 hour(s))  HgB A1c     Status: Abnormal   Collection Time: 12/29/20 10:54 AM  Result Value Ref Range   Hemoglobin A1C     HbA1c POC (<> result, manual entry)     HbA1c, POC (prediabetic range)     HbA1c, POC (controlled diabetic range) 8.0 (A) 0.0 - 7.0 %     Assessment & Plan:   1. 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 Graves' disease-  Resolved  -Her previsit thyroid function tests are consistent with appropriate replacement.  She is advised to continue levothyroxine 88 mcg p.o. daily before breakfast.     - We discussed about the correct intake of her thyroid hormone, on empty stomach at fasting, with water, separated by at least 30 minutes from breakfast and other medications,  and separated by more than 4 hours from calcium, iron, multivitamins, acid reflux medications (PPIs). -Patient is made aware of the fact that thyroid hormone replacement is needed for life, dose to be adjusted by periodic monitoring of thyroid function tests.    Graves' disease has resolved.   3.  Type 1 diabetes Her diabetes is now confirmed to be type 1. She is responding to basal insulin with point-of-care A1c of 8%, improving from 9.6%.  -She will continue to need intensive treatment with basal/bolus insulin in order for her to achieve control of diabetes target.    -She is  advised to continue Levemir 25 units nightly, increase NovoLog to 12 -15  units 3 times daily AC, for Premeal blood glucose readings above 90 mg per DL, associated with strict monitoring of blood glucose 4 times a day-before meals and at bedtime.  -She is  encouraged to continue to use her CGM device at all times.  - she acknowledges that there is a room for improvement in her food and drink choices. - Suggestion is made for her to avoid simple carbohydrates  from her diet including Cakes, Sweet Desserts, Ice Cream, Soda (diet and regular), Sweet Tea, Candies, Chips, Cookies, Store Bought Juices, Alcohol in Excess of  1-2 drinks a  day, Artificial Sweeteners,  Coffee Creamer, and "Sugar-free" Products, Lemonade. This will help patient to have more stable blood glucose profile and potentially avoid unintended weight gain.  -She is encouraged to call clinic for hypoglycemia below 70 or greater than 200x3.   4.  Hypertension: Her blood pressure is not controlled to target.    She is advised to continue losartan 50 mg p.o. daily, hydrochlorothiazide 25 mg p.o. daily.  She is advised on salt restrictions.  - I advised patient to maintain close follow up with Christena Flake, MD for her diabetes and primary care needs.  I spent 41 minutes in the care of the patient today including review of labs from CMP, Lipids, Thyroid Function, Hematology (current and previous including abstractions from other facilities); face-to-face time discussing  her blood glucose readings/logs, discussing hypoglycemia and hyperglycemia episodes and symptoms, medications doses, her options of short and long term treatment based on the latest standards of care / guidelines;  discussion about incorporating lifestyle medicine;  and documenting the encounter.    Please refer to Patient Instructions for Blood Glucose Monitoring and Insulin/Medications Dosing Guide"  in media tab for additional information. Please  also refer to " Patient  Self Inventory" in the Media  tab for reviewed elements of pertinent patient history.  Kimiye Strathman participated in the discussions, expressed understanding, and voiced agreement with the above plans.  All questions were answered to her satisfaction. she is encouraged to contact clinic should she have any questions or concerns prior to her return visit.   Follow up plan: Return in about 4 months (around 04/30/2021) for F/U with Pre-visit Labs, Meter, Logs, A1c here.Marquis Lunch, MD Phone: [phone]  Fax: [phone]   This note was partially dictated with voice recognition software. Similar sounding words can be transcribed inadequately or may not  be corrected upon review.  12/29/2020, 12:56 PM

## 2020-12-29 NOTE — Patient Instructions (Signed)

## 2020-12-30 ENCOUNTER — Telehealth: Payer: Self-pay | Admitting: "Endocrinology

## 2020-12-30 ENCOUNTER — Other Ambulatory Visit: Payer: Self-pay | Admitting: "Endocrinology

## 2020-12-30 DIAGNOSIS — E890 Postprocedural hypothyroidism: Secondary | ICD-10-CM

## 2020-12-30 MED ORDER — FREESTYLE LIBRE 2 SENSOR MISC: 1.0000 | 2 refills | Status: DC

## 2020-12-30 MED ORDER — INSULIN REGULAR HUMAN 100 UNIT/ML IJ SOPN: 12.0000 [IU] | PEN_INJECTOR | Freq: Three times a day (TID) | INTRAMUSCULAR | 2 refills | Status: DC

## 2020-12-30 NOTE — Telephone Encounter (Signed)
Pt is calling and states she is needing ReliOn Novolog sent in to [facility] instead of regular Novolog because it is cheaper.   Pt also states her freestyle libre 2 sensors that were sent to [facility] needs to be sent to the mail order pharmacy that we sent it to last time. She is unsure of the name of pharmacy

## 2020-12-30 NOTE — Telephone Encounter (Signed)
Ok to send Novolin? Pt states she is is taking this instead of Novolog.

## 2020-12-30 NOTE — Telephone Encounter (Signed)
Rx sent 

## 2021-02-14 ENCOUNTER — Other Ambulatory Visit: Payer: Self-pay | Admitting: "Endocrinology

## 2021-02-14 DIAGNOSIS — E890 Postprocedural hypothyroidism: Secondary | ICD-10-CM

## 2021-03-31 ENCOUNTER — Other Ambulatory Visit: Payer: Self-pay

## 2021-03-31 ENCOUNTER — Telehealth: Payer: Self-pay | Admitting: "Endocrinology

## 2021-03-31 DIAGNOSIS — E890 Postprocedural hypothyroidism: Secondary | ICD-10-CM

## 2021-03-31 DIAGNOSIS — E1065 Type 1 diabetes mellitus with hyperglycemia: Secondary | ICD-10-CM

## 2021-03-31 MED ORDER — BD PEN NEEDLE SHORT U/F 31G X 8 MM MISC: 3 refills | Status: DC

## 2021-03-31 NOTE — Telephone Encounter (Signed)
Medication has been sent to pharmacy requested. °

## 2021-03-31 NOTE — Telephone Encounter (Signed)
Patient is requesting a refill on her pen needles. Please send to [facility]

## 2021-04-05 LAB — LIPID PANEL
Cholesterol: 151 (ref 0–200)
HDL: 89 — AB (ref 35–70)
LDL Cholesterol: 48
Triglycerides: 72 (ref 40–160)

## 2021-04-05 LAB — BASIC METABOLIC PANEL
BUN: 18 (ref 4–21)
Creatinine: 1.1 (ref 0.5–1.1)

## 2021-04-05 LAB — HEMOGLOBIN A1C: Hemoglobin A1C: 9.7

## 2021-04-05 LAB — TSH: TSH: 6.24 — AB (ref 0.41–5.90)

## 2021-05-12 ENCOUNTER — Other Ambulatory Visit: Payer: Self-pay

## 2021-05-12 ENCOUNTER — Encounter: Payer: Self-pay | Admitting: "Endocrinology

## 2021-05-12 ENCOUNTER — Ambulatory Visit (INDEPENDENT_AMBULATORY_CARE_PROVIDER_SITE_OTHER): Payer: Medicare HMO | Admitting: "Endocrinology

## 2021-05-12 VITALS — BP 136/78 | HR 68 | Ht 71.0 in | Wt 185.8 lb

## 2021-05-12 DIAGNOSIS — I10 Essential (primary) hypertension: Secondary | ICD-10-CM

## 2021-05-12 DIAGNOSIS — E890 Postprocedural hypothyroidism: Secondary | ICD-10-CM

## 2021-05-12 DIAGNOSIS — E1065 Type 1 diabetes mellitus with hyperglycemia: Secondary | ICD-10-CM

## 2021-05-12 MED ORDER — NOVOLOG FLEXPEN RELION 100 UNIT/ML ~~LOC~~ SOPN: 10.0000 [IU] | PEN_INJECTOR | Freq: Three times a day (TID) | SUBCUTANEOUS | 2 refills | Status: DC

## 2021-05-12 MED ORDER — LEVEMIR FLEXTOUCH 100 UNIT/ML ~~LOC~~ SOPN: PEN_INJECTOR | SUBCUTANEOUS | 2 refills | Status: DC

## 2021-05-12 NOTE — Progress Notes (Signed)
05/12/2021                  Endocrinology follow-up note  Subjective:    Patient ID: Kristi Estrada, female    DOB: [birth year], PCP Christena Flake, MD.   Past Medical History:  Diagnosis Date   Diabetes mellitus type I (HCC)    Hyperthyroidism    History reviewed. No pertinent surgical history. Social History   Socioeconomic History   Marital status: Married    Spouse name: Not on file   Number of children: Not on file   Years of education: Not on file   Highest education level: Not on file  Occupational History   Not on file  Tobacco Use   Smoking status: Never   Smokeless tobacco: Never  Vaping Use   Vaping Use: Never used  Substance and Sexual Activity   Alcohol use: No    Alcohol/week: 0.0 standard drinks   Drug use: No   Sexual activity: Not on file  Other Topics Concern   Not on file  Social History Narrative   Not on file   Social Determinants of Health   Financial Resource Strain: Not on file  Food Insecurity: Not on file  Transportation Needs: Not on file  Physical Activity: Not on file  Stress: Not on file  Social Connections: Not on file   Outpatient Encounter Medications as of 05/12/2021  Medication Sig   insulin aspart (NOVOLOG FLEXPEN) 100 UNIT/ML FlexPen Inject 10-13 Units into the skin 3 (three) times daily with meals.   alendronate (FOSAMAX) 35 MG tablet TAKE 1 TAB BY MOUTH ONCE A WEEK   aspirin EC 81 MG tablet Take by mouth.   B-D ULTRAFINE III SHORT PEN 31G X 8 MM MISC USE SUBCUTANEOUSLY 4 TIMES A DAY   cholecalciferol (VITAMIN D) 1000 units tablet Take 1,000 Units by mouth daily.   Continuous Blood Gluc Receiver (FREESTYLE LIBRE 2 READER) DEVI As directed   Continuous Blood Gluc Sensor (FREESTYLE LIBRE 2 SENSOR) MISC 1 Piece by Does not apply route every 14 (fourteen) days.   escitalopram (LEXAPRO) 10 MG tablet Take 10 mg by mouth daily.   hydrochlorothiazide (HYDRODIURIL) 25 MG tablet Take 25 mg by mouth daily.   LEVEMIR FLEXTOUCH 100  UNIT/ML FlexTouch Pen INJECT 20 UNITS SUBCUTANEOUSLY AT BEDTIME   levothyroxine (SYNTHROID) 88 MCG tablet TAKE 1 TABLET BY MOUTH EVERY DAY BEFORE BREAKFAST   losartan (COZAAR) 50 MG tablet Take by mouth.   mirtazapine (REMERON) 15 MG tablet at bedtime.   Multiple Vitamin (MULTIVITAMIN) tablet Take 1 tablet by mouth daily.   pravastatin (PRAVACHOL) 20 MG tablet    [DISCONTINUED] Insulin Regular Human (HUMULIN R) 100 UNIT/ML KwikPen Inject 12-15 Units as directed 3 (three) times daily before meals.   [DISCONTINUED] LEVEMIR FLEXTOUCH 100 UNIT/ML FlexPen INJECT 25 UNITS SUBCUTANEOUSLY AT BEDTIME   [DISCONTINUED] NOVOLOG FLEXPEN 100 UNIT/ML FlexPen Inject 12-15 Units into the skin 3 (three) times daily with meals.   No facility-administered encounter medications on file as of 05/12/2021.   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 -She is currently on levothyroxine 88 mcg p.o. daily before breakfast.     She admits to some inconsistency taking her levothyroxine, previsit labs  show TSH above target at 6.24, free T4 on target at 1.16.    .-She is also being followed for chronically uncontrolled diabetes now confirmed to be type 1.   During her recent visit she was put on basal/bolus insulin for better control.  Her CGM device shows loss of control of diabetes with 36% time range, 60% above range.  No major sustained hypoglycemia.  Her previsit labs show A1c of 9.7%, increasing from 8% during her last visit.  She admits to missed coordinated meal/insulin timing, missing at least half of her prandial insulin injection opportunities. She continues to struggle with social stress taking care of  her disabled daughter.   She was diagnosed with diabetes at approximate age of 71.    The patient has family history of thyroid dysfunction  in her mother and in one of her daughters.  Review of Systems Limited as above.   Objective:    BP 136/78    Pulse 68    Ht 5\' 11"  (1.803 m)    Wt 185 lb 12.8 oz (84.3 kg)    BMI 25.91 kg/m   Wt Readings from Last 3 Encounters:  05/12/21 185 lb 12.8 oz (84.3 kg)  12/29/20 184 lb (83.5 kg)  08/26/20 179 lb 6.4 oz (81.4 kg)      [facility] showed: Right lobe 5.3 cm x 1.6 cm x 1.9 cm heterogenous and appearance. Left lobe 4.8 cm x 1.9 cm x 1.4 cm heterogenous with a 3.7 mm solid/hypoechoic nodule.  -  On 09/21/2016: Thyroid uptake and scan showed 50.8% of uniform uptake consistent with Graves' disease. Recent Results (from the past 2160 hour(s))  Basic metabolic panel     Status: None   Collection Time: 04/05/21 12:00 AM  Result Value Ref Range   BUN 18 4 - 21   Creatinine 1.1 0.5 - 1.1  Lipid panel     Status: Abnormal   Collection Time: 04/05/21 12:00 AM  Result Value Ref Range   Triglycerides 72 40 - 160   Cholesterol 151 0 - 200   HDL 89 (A) 35 - 70   LDL Cholesterol 48   Hemoglobin A1c     Status: None   Collection Time: 04/05/21 12:00 AM  Result Value Ref Range   Hemoglobin A1C 9.7   TSH     Status: Abnormal   Collection Time: 04/05/21 12:00 AM  Result Value Ref Range   TSH 6.24 (A) 0.41 - 5.90    Comment: free t4 1.16     Assessment & Plan:   1. 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 Graves' disease-  Resolved  -Her previsit thyroid function tests are consistent with under replacement, however patient is not consistent taking her medication.  She is advised to continue levothyroxine 88 mcg p.o. daily before breakfast.    - We discussed about the correct intake of her thyroid hormone, on empty stomach at fasting, with water, separated by at least 30 minutes  from breakfast and other medications,  and separated by more than 4 hours from calcium, iron, multivitamins, acid reflux medications (PPIs). -Patient is made aware of the fact that thyroid hormone replacement is needed for life, dose to be adjusted by periodic monitoring of thyroid function tests.     Graves' disease has resolved.   3.  Type 1 diabetes Her diabetes is now confirmed to be type 1. She is responding to basal insulin with point-of-care A1c of 8%, improving  from 9.6%.  -She will continue to need intensive treatment with basal/bolus insulin in order for her to achieve control of diabetes target.    -She worries about overnight hypoglycemia.  She is advised to lower her Levemir to 20 units nightly, lower her NovoLog to 10-13   units 3 times daily AC, for Premeal blood glucose readings above 90 mg per DL, associated with strict monitoring of blood glucose 4 times a day-before meals and at bedtime.  -She is  encouraged to continue to use her CGM device at all times. - she acknowledges that there is a room for improvement in her food and drink choices. - Suggestion is made for her to avoid simple carbohydrates  from her diet including Cakes, Sweet Desserts, Ice Cream, Soda (diet and regular), Sweet Tea, Candies, Chips, Cookies, Store Bought Juices, Alcohol in Excess of  1-2 drinks a day, Artificial Sweeteners,  Coffee Creamer, and "Sugar-free" Products, Lemonade. This will help patient to have more stable blood glucose profile and potentially avoid unintended weight gain.  -She is encouraged to call clinic for hypoglycemia below 70 or greater than 200x3.   4.  Hypertension: Her blood pressure is control to target.  She is advised to continue losartan 50 mg p.o. daily, hydrochlorothiazide 25 mg p.o. daily.  She is advised on salt restrictions.  - I advised patient to maintain close follow up with Christena Flake, MD for her diabetes and primary care needs.    I spent 40 minutes in the  care of the patient today including review of labs from CMP, Lipids, Thyroid Function, Hematology (current and previous including abstractions from other facilities); face-to-face time discussing  her blood glucose readings/logs, discussing hypoglycemia and hyperglycemia episodes and symptoms, medications doses, her options of short and long term treatment based on the latest standards of care / guidelines;  discussion about incorporating lifestyle medicine;  and documenting the encounter.    Please refer to Patient Instructions for Blood Glucose Monitoring and Insulin/Medications Dosing Guide"  in media tab for additional information. Please  also refer to " Patient Self Inventory" in the Media  tab for reviewed elements of pertinent patient history.  Dyllan Kats participated in the discussions, expressed understanding, and voiced agreement with the above plans.  All questions were answered to her satisfaction. she is encouraged to contact clinic should she have any questions or concerns prior to her return visit.    Follow up plan: Return in about 4 weeks (around 06/09/2021) for F/U with Meter and Logs Only - no Labs.  Marquis Lunch, MD Phone: [phone]  Fax: [phone]   This note was partially dictated with voice recognition software. Similar sounding words can be transcribed inadequately or may not  be corrected upon review.  05/12/2021, 1:01 PM

## 2021-05-12 NOTE — Patient Instructions (Signed)

## 2021-05-31 ENCOUNTER — Other Ambulatory Visit: Payer: Self-pay | Admitting: "Endocrinology

## 2021-06-09 ENCOUNTER — Other Ambulatory Visit: Payer: Self-pay

## 2021-06-09 ENCOUNTER — Encounter: Payer: Self-pay | Admitting: "Endocrinology

## 2021-06-09 ENCOUNTER — Ambulatory Visit (INDEPENDENT_AMBULATORY_CARE_PROVIDER_SITE_OTHER): Payer: Medicare HMO | Admitting: "Endocrinology

## 2021-06-09 VITALS — BP 142/78 | HR 92 | Ht 71.0 in | Wt 184.8 lb

## 2021-06-09 DIAGNOSIS — E1065 Type 1 diabetes mellitus with hyperglycemia: Secondary | ICD-10-CM

## 2021-06-09 MED ORDER — TRESIBA FLEXTOUCH 100 UNIT/ML ~~LOC~~ SOPN: 22.0000 [IU] | PEN_INJECTOR | Freq: Every day | SUBCUTANEOUS | 1 refills | Status: DC

## 2021-06-09 MED ORDER — NOVOLOG FLEXPEN RELION 100 UNIT/ML ~~LOC~~ SOPN: 12.0000 [IU] | PEN_INJECTOR | Freq: Three times a day (TID) | SUBCUTANEOUS | 2 refills | Status: DC

## 2021-06-09 NOTE — Patient Instructions (Signed)

## 2021-06-09 NOTE — Progress Notes (Signed)
06/09/2021                  Endocrinology follow-up note  Subjective:    Patient ID: Kristi Estrada Agee, female    DOB: 05/19/1950, PCP Olena Mater, MD.   Past Medical History:  Diagnosis Date   Diabetes mellitus type I ([place])    Hyperthyroidism    History reviewed. No pertinent surgical history. Social History   Socioeconomic History   Marital status: Married    Spouse name: Not on file   Number of children: Not on file   Years of education: Not on file   Highest education level: Not on file  Occupational History   Not on file  Tobacco Use   Smoking status: Never   Smokeless tobacco: Never  Vaping Use   Vaping Use: Never used  Substance and Sexual Activity   Alcohol use: No    Alcohol/week: 0.0 standard drinks   Drug use: No   Sexual activity: Not on file  Other Topics Concern   Not on file  Social History Narrative   Not on file   Social Determinants of Health   Financial Resource Strain: Not on file  Food Insecurity: Not on file  Transportation Needs: Not on file  Physical Activity: Not on file  Stress: Not on file  Social Connections: Not on file   Outpatient Encounter Medications as of 06/09/2021  Medication Sig   insulin degludec (TRESIBA FLEXTOUCH) 100 UNIT/ML FlexTouch Pen Inject 22 Units into the skin at bedtime.   alendronate (FOSAMAX) 35 MG tablet TAKE 1 TAB BY MOUTH ONCE A WEEK   aspirin EC 81 MG tablet Take by mouth.   B-D ULTRAFINE III SHORT PEN 31G X 8 MM MISC USE SUBCUTANEOUSLY 4 TIMES A DAY   cholecalciferol (VITAMIN D) 1000 units tablet Take 1,000 Units by mouth daily.   Continuous Blood Gluc Receiver (FREESTYLE LIBRE 2 READER) DEVI As directed   Continuous Blood Gluc Sensor (FREESTYLE LIBRE 2 SENSOR) MISC 1 Piece by Does not apply route every 14 (fourteen) days.   escitalopram (LEXAPRO) 10 MG tablet Take 10 mg by mouth daily.   hydrochlorothiazide (HYDRODIURIL) 25 MG tablet Take 25 mg by mouth daily.   insulin aspart (NOVOLOG FLEXPEN) 100  UNIT/ML FlexPen Inject 12-15 Units into the skin 3 (three) times daily with meals.   levothyroxine (SYNTHROID) 88 MCG tablet TAKE 1 TABLET BY MOUTH EVERY DAY BEFORE BREAKFAST   losartan (COZAAR) 50 MG tablet Take by mouth.   mirtazapine (REMERON) 15 MG tablet at bedtime.   Multiple Vitamin (MULTIVITAMIN) tablet Take 1 tablet by mouth daily.   pravastatin (PRAVACHOL) 20 MG tablet    [DISCONTINUED] insulin aspart (NOVOLOG FLEXPEN) 100 UNIT/ML FlexPen Inject 10-13 Units into the skin 3 (three) times daily with meals.   [DISCONTINUED] LEVEMIR FLEXTOUCH 100 UNIT/ML FlexTouch Pen INJECT 20 UNITS SUBCUTANEOUSLY AT BEDTIME   No facility-administered encounter medications on file as of 06/09/2021.   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 -She is currently on levothyroxine 88 mcg p.o. daily before breakfast.     She admits to some inconsistency taking her levothyroxine, previsit labs show TSH above target at 6.24, free T4 on target at 1.16.    Marland Kitchen-  She is also being followed for chronically uncontrolled diabetes now confirmed to be type 1.   During her recent visit she was put on basal/bolus insulin for better control.  She presents with her CGM showing 36% time range, 61% above range.  No major hypoglycemia.  This is an improvement from her last visit presentation.  Her recent A1c was 9.7%.    She admits to miscoordinated meal/insulin timing, missing at least half of her prandial insulin injection opportunities. She continues to struggle with social stress taking care of her disabled daughter.   She was diagnosed with diabetes at approximate age of 62.    The patient has family history of  thyroid dysfunction  in her mother and in one of her daughters.  Review of Systems Limited as above.   Objective:    BP (!) 142/78    Pulse 92    Ht 5\' 11"  (1.803 m)    Wt 184 lb 12.8 oz (83.8 kg)    BMI 25.77 kg/m   Wt Readings from Last 3 Encounters:  06/09/21 184 lb 12.8 oz (83.8 kg)  05/12/21 185 lb 12.8 oz (84.3 kg)  12/29/20 184 lb (83.5 kg)      Thyroid ultrasound from 11/16/2015 in [facility] showed: Right lobe 5.3 cm x 1.6 cm x 1.9 cm heterogenous and appearance. Left lobe 4.8 cm x 1.9 cm x 1.4 cm heterogenous with a 3.7 mm solid/hypoechoic nodule.  -  On 09/21/2016: Thyroid uptake and scan showed 50.8% of uniform uptake consistent with Graves' disease. Recent Results (from the past 2160 hour(s))  Basic metabolic panel     Status: None   Collection Time: 04/05/21 12:00 AM  Result Value Ref Range   BUN 18 4 - 21   Creatinine 1.1 0.5 - 1.1  Lipid panel     Status: Abnormal   Collection Time: 04/05/21 12:00 AM  Result Value Ref Range   Triglycerides 72 40 - 160   Cholesterol 151 0 - 200   HDL 89 (A) 35 - 70   LDL Cholesterol 48   Hemoglobin A1c     Status: None   Collection Time: 04/05/21 12:00 AM  Result Value Ref Range   Hemoglobin A1C 9.7   TSH     Status: Abnormal   Collection Time: 04/05/21 12:00 AM  Result Value Ref Range   TSH 6.24 (A) 0.41 - 5.90    Comment: free t4 1.16     Assessment & Plan:   1. 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 Graves' disease-  Resolved  -Her previsit thyroid function tests are consistent with under replacement, however patient is not consistent taking her medication.  She is advised to continue levothyroxine 88 mcg p.o. daily before breakfast.    - We discussed about the correct intake of her thyroid hormone, on empty stomach at fasting, with water, separated by at least 30 minutes from breakfast and other medications,  and separated by more than 4 hours from calcium, iron, multivitamins, acid reflux  medications (PPIs). -Patient is made aware of the fact that thyroid hormone replacement is needed for life, dose to be adjusted by periodic monitoring of thyroid function tests.    Graves' disease has resolved.   3.  Type 1 diabetes Her diabetes is now confirmed to be type 1. She is responding to basal insulin with point-of-care A1c of 8%, improving from 9.6%.  -She will continue to need intensive treatment with basal/bolus insulin in order for her to  achieve control of diabetes target.    -She worries about overnight hypoglycemia.  Based on her presentation, she is advised to increase her basal insulin Tresiba 22 units nightly, increase NovoLog to 12 -15  units 3 times daily AC, for Premeal blood glucose readings above 90 mg per DL, associated with strict monitoring of blood glucose 4 times a day-before meals and at bedtime.  -She is  encouraged to continue to use her CGM device at all times. - she acknowledges that there is a room for improvement in her food and drink choices. - Suggestion is made for her to avoid simple carbohydrates  from her diet including Cakes, Sweet Desserts, Ice Cream, Soda (diet and regular), Sweet Tea, Candies, Chips, Cookies, Store Bought Juices, Alcohol in Excess of  1-2 drinks a day, Artificial Sweeteners,  Coffee Creamer, and "Sugar-free" Products, Lemonade. This will help patient to have more stable blood glucose profile and potentially avoid unintended weight gain.  -She is encouraged to call clinic for hypoglycemia below 70 or greater than 200x3.   4.  Hypertension: Her blood pressure is control to target.  She is advised to continue losartan 50 mg p.o. daily, hydrochlorothiazide 25 mg p.o. daily.  She is advised on salt restrictions.  - I advised patient to maintain close follow up with Olena Mater, MD for her diabetes and primary care needs.   I spent 32 minutes in the care of the patient today including review of labs from [facility], Lipids, Thyroid  Function, Hematology (current and previous including abstractions from other facilities); face-to-face time discussing  her blood glucose readings/logs, discussing hypoglycemia and hyperglycemia episodes and symptoms, medications doses, her options of short and long term treatment based on the latest standards of care / guidelines;  discussion about incorporating lifestyle medicine;  and documenting the encounter.    Please refer to Patient Instructions for Blood Glucose Monitoring and Insulin/Medications Dosing Guide"  in media tab for additional information. Please  also refer to " Patient Self Inventory" in the Media  tab for reviewed elements of pertinent patient history.  Halee Moros participated in the discussions, expressed understanding, and voiced agreement with the above plans.  All questions were answered to her satisfaction. she is encouraged to contact clinic should she have any questions or concerns prior to her return visit.     Follow up plan: Return in about 3 months (around 09/06/2021) for F/U with Pre-visit Labs, Meter, Logs, A1c here.Glade Lloyd, MD Phone: [phone]  Fax: [phone]   This note was partially dictated with voice recognition software. Similar sounding words can be transcribed inadequately or may not  be corrected upon review.  06/09/2021, 1:10 PM

## 2021-08-23 ENCOUNTER — Other Ambulatory Visit: Payer: Self-pay | Admitting: "Endocrinology

## 2021-08-23 DIAGNOSIS — E890 Postprocedural hypothyroidism: Secondary | ICD-10-CM

## 2021-09-06 ENCOUNTER — Ambulatory Visit: Payer: Medicare HMO | Admitting: "Endocrinology

## 2021-09-06 ENCOUNTER — Encounter: Payer: Self-pay | Admitting: "Endocrinology

## 2021-09-06 VITALS — BP 122/56 | HR 76 | Ht 71.0 in | Wt 186.2 lb

## 2021-09-06 DIAGNOSIS — E1065 Type 1 diabetes mellitus with hyperglycemia: Secondary | ICD-10-CM

## 2021-09-06 DIAGNOSIS — I10 Essential (primary) hypertension: Secondary | ICD-10-CM | POA: Diagnosis not present

## 2021-09-06 DIAGNOSIS — E890 Postprocedural hypothyroidism: Secondary | ICD-10-CM | POA: Diagnosis not present

## 2021-09-06 LAB — POCT GLYCOSYLATED HEMOGLOBIN (HGB A1C): HbA1c, POC (controlled diabetic range): 8.3 % — AB (ref 0.0–7.0)

## 2021-09-06 MED ORDER — TRESIBA FLEXTOUCH 100 UNIT/ML ~~LOC~~ SOPN: 26.0000 [IU] | PEN_INJECTOR | Freq: Every day | SUBCUTANEOUS | 1 refills | Status: DC

## 2021-09-06 NOTE — Patient Instructions (Signed)

## 2021-09-06 NOTE — Progress Notes (Signed)
09/06/2021 ?                 ?Endocrinology follow-up note ? ?Subjective:  ? ? Patient ID: Kristi Estrada, female    DOB: 06/20/1950, PCP Christena Flake, MD. ? ? ?Past Medical History:  ?Diagnosis Date  ? Diabetes mellitus type I (HCC)   ? Hyperthyroidism   ? ?History reviewed. No pertinent surgical history. ?Social History  ? ?Socioeconomic History  ? Marital status: Married  ?  Spouse name: Not on file  ? Number of children: Not on file  ? Years of education: Not on file  ? Highest education level: Not on file  ?Occupational History  ? Not on file  ?Tobacco Use  ? Smoking status: Never  ? Smokeless tobacco: Never  ?Vaping Use  ? Vaping Use: Never used  ?Substance and Sexual Activity  ? Alcohol use: No  ?  Alcohol/week: 0.0 standard drinks  ? Drug use: No  ? Sexual activity: Not on file  ?Other Topics Concern  ? Not on file  ?Social History Narrative  ? Not on file  ? ?Social Determinants of Health  ? ?Financial Resource Strain: Not on file  ?Food Insecurity: Not on file  ?Transportation Needs: Not on file  ?Physical Activity: Not on file  ?Stress: Not on file  ?Social Connections: Not on file  ? ?Outpatient Encounter Medications as of 09/06/2021  ?Medication Sig  ? alendronate (FOSAMAX) 35 MG tablet TAKE 1 TAB BY MOUTH ONCE A WEEK  ? aspirin EC 81 MG tablet Take by mouth.  ? B-D ULTRAFINE III SHORT PEN 31G X 8 MM MISC USE SUBCUTANEOUSLY 4 TIMES A DAY  ? cholecalciferol (VITAMIN D) 1000 units tablet Take 1,000 Units by mouth daily.  ? Continuous Blood Gluc Receiver (FREESTYLE LIBRE 2 READER) DEVI As directed  ? Continuous Blood Gluc Sensor (FREESTYLE LIBRE 2 SENSOR) MISC 1 Piece by Does not apply route every 14 (fourteen) days.  ? escitalopram (LEXAPRO) 10 MG tablet Take 10 mg by mouth daily.  ? hydrochlorothiazide (HYDRODIURIL) 25 MG tablet Take 25 mg by mouth daily.  ? insulin aspart (NOVOLOG FLEXPEN) 100 UNIT/ML FlexPen Inject 12-15 Units into the skin 3 (three) times daily with meals.  ? insulin degludec (TRESIBA  FLEXTOUCH) 100 UNIT/ML FlexTouch Pen Inject 26 Units into the skin at bedtime.  ? levothyroxine (SYNTHROID) 88 MCG tablet TAKE 1 TABLET BY MOUTH EVERY DAY BEFORE BREAKFAST  ? losartan (COZAAR) 50 MG tablet Take by mouth.  ? mirtazapine (REMERON) 15 MG tablet at bedtime.  ? Multiple Vitamin (MULTIVITAMIN) tablet Take 1 tablet by mouth daily.  ? pravastatin (PRAVACHOL) 20 MG tablet   ? telmisartan (MICARDIS) 80 MG tablet Take 80 mg by mouth daily.  ? [DISCONTINUED] insulin degludec (TRESIBA FLEXTOUCH) 100 UNIT/ML FlexTouch Pen Inject 22 Units into the skin at bedtime.  ? ?No facility-administered encounter medications on file as of 09/06/2021.  ? ?ALLERGIES: ?Allergies  ?Allergen Reactions  ? Metformin Nausea Only  ? ?VACCINATION STATUS: ?Immunization History  ?Administered Date(s) Administered  ? Moderna Sars-Covid-2 Vaccination 07/10/2019, 08/07/2019, 03/26/2020  ? ? ?HPI ? 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 ?-She is currently on levothyroxine 88 mcg p.o. daily before breakfast.   ?  She admits to some inconsistency taking her levothyroxine, previsit labs show TSH above target at 6.24, free T4 on target at 1.16.   ? ?Marland Kitchen-She is also  being followed for chronically uncontrolled diabetes now confirmed to be type 1.   During her recent visit she was put on basal/bolus insulin, presenting with better control of her diabetes.  She presents with her CGM showing 40% time range, 58% above range.  2% mild hypoglycemia.   ?Her point-of-care A1c is 6.2%, improving from 9.7%.  She is doing much better and coordinating her insulin with her meals. ?  She was diagnosed with diabetes at approximate age of 9.  ? ? ?The patient has family history of thyroid dysfunction  in her mother and in one of her daughters. ? ?Review of Systems ?Limited as above. ? ? ?Objective:  ?  ?BP (!) 122/56   Pulse 76   Ht 5\' 11"  (1.803 m)   Wt 186 lb  3.2 oz (84.5 kg)   BMI 25.97 kg/m?   ?Wt Readings from Last 3 Encounters:  ?09/06/21 186 lb 3.2 oz (84.5 kg)  ?06/09/21 184 lb 12.8 oz (83.8 kg)  ?05/12/21 185 lb 12.8 oz (84.3 kg)  ?  ? ? ?Thyroid ultrasound from 11/16/2015 in [facility] showed: ?Right lobe 5.3 cm x 1.6 cm x 1.9 cm heterogenous and appearance. Left lobe 4.8 cm x 1.9 cm x 1.4 cm heterogenous with a 3.7 mm solid/hypoechoic nodule. ? ?-  On 09/21/2016: Thyroid uptake and scan showed 50.8% of uniform uptake consistent with Graves' disease. ?Recent Results (from the past 2160 hour(s))  ?HgB A1c     Status: Abnormal  ? Collection Time: 09/06/21 10:35 AM  ?Result Value Ref Range  ? Hemoglobin A1C    ? HbA1c POC (<> result, manual entry)    ? HbA1c, POC (prediabetic range)    ? HbA1c, POC (controlled diabetic range) 8.3 (A) 0.0 - 7.0 %  ? ? ? ?Assessment & Plan:  ? 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 Graves' disease-  Resolved ? ?-Her previsit thyroid function tests are consistent with under replacement, however patient is not consistent taking her medication.  She is advised to continue levothyroxine 88 mcg p.o. daily before breakfast.  ? - We discussed about the correct intake of her thyroid hormone, on empty stomach at fasting, with water, separated by at least 30 minutes from breakfast and other medications,  and separated by more than 4 hours from calcium, iron, multivitamins, acid reflux medications (PPIs). ?-Patient is made aware of the fact that thyroid hormone replacement is needed for life, dose to be adjusted by periodic monitoring of thyroid function tests. ? ?  ?Graves' disease has resolved.   ?3.  Type 1 diabetes ?Her diabetes is now confirmed to be type 1. ?She is responding to basal insulin with point-of-care A1c of 8%, improving from 9.6%. ? ?-She will continue to need intensive treatment with basal/bolus insulin in order for her to achieve control of diabetes target.   ? ?-She worries about overnight  hypoglycemia.  Based on her presentation, she will not tolerate another higher dose of basal insulin.  I discussed and increase her Tresiba to 26 units nightly, continue NovoLog  12 -15  units 3 times daily AC, for Premeal blood glucose readings above 90 mg per DL, associated with strict monitoring of blood glucose 4 times a day-before meals and at bedtime.  ?-She is  encouraged to continue to use her CGM device at all times. ?- she acknowledges that there is a room for improvement in her food and drink choices. ?- Suggestion is made for her to avoid simple carbohydrates  from her diet including Cakes, Sweet Desserts, Ice Cream, Soda (diet and regular), Sweet Tea, Candies, Chips, Cookies, Store Bought Juices, Alcohol in Excess of  1-2 drinks a day, Artificial Sweeteners,  Coffee Creamer, and "Sugar-free" Products, Lemonade. This will help patient to have more stable blood glucose profile and potentially avoid unintended weight gain. ? ?-She is encouraged to call clinic for hypoglycemia below 70 or greater than 200x3. ? ? ?4.  Hypertension: Her blood pressure is control to target.  She is advised to continue losartan 50 mg p.o. daily, hydrochlorothiazide 25 mg p.o. daily.  She is advised on salt restrictions. ? ?- I advised patient to maintain close follow up with Christena FlakeZimmer, William, MD for her diabetes and primary care needs. ? ? ?I spent 35 minutes in the care of the patient today including review of labs from CMP, Lipids, Thyroid Function, Hematology (current and previous including abstractions from other facilities); face-to-face time discussing  her blood glucose readings/logs, discussing hypoglycemia and hyperglycemia episodes and symptoms, medications doses, her options of short and long term treatment based on the latest standards of care / guidelines;  discussion about incorporating lifestyle medicine;  and documenting the encounter. ? ?  Please refer to Patient Instructions for Blood Glucose Monitoring and  Insulin/Medications Dosing Guide"  in media tab for additional information. Please  also refer to " Patient Self Inventory" in the Media  tab for reviewed elements of pertinent patient history. ? ?Tawana ScaleBetty Esh part

## 2021-10-25 ENCOUNTER — Other Ambulatory Visit: Payer: Self-pay | Admitting: "Endocrinology

## 2021-11-01 ENCOUNTER — Other Ambulatory Visit: Payer: Self-pay | Admitting: "Endocrinology

## 2021-11-01 DIAGNOSIS — E890 Postprocedural hypothyroidism: Secondary | ICD-10-CM

## 2021-11-30 ENCOUNTER — Other Ambulatory Visit: Payer: Self-pay | Admitting: "Endocrinology

## 2021-12-08 ENCOUNTER — Ambulatory Visit: Payer: Medicare HMO | Admitting: "Endocrinology

## 2021-12-08 ENCOUNTER — Encounter: Payer: Self-pay | Admitting: "Endocrinology

## 2021-12-08 VITALS — BP 144/82 | HR 64 | Ht 71.0 in | Wt 190.0 lb

## 2021-12-08 DIAGNOSIS — E890 Postprocedural hypothyroidism: Secondary | ICD-10-CM | POA: Diagnosis not present

## 2021-12-08 DIAGNOSIS — I10 Essential (primary) hypertension: Secondary | ICD-10-CM | POA: Diagnosis not present

## 2021-12-08 DIAGNOSIS — E1065 Type 1 diabetes mellitus with hyperglycemia: Secondary | ICD-10-CM

## 2021-12-08 LAB — POCT GLYCOSYLATED HEMOGLOBIN (HGB A1C): HbA1c, POC (controlled diabetic range): 8.6 % — AB (ref 0.0–7.0)

## 2021-12-08 NOTE — Patient Instructions (Signed)
                                     Advice for Weight Management  -For most of us the best way to lose weight is by diet management. Generally speaking, diet management means consuming less calories intentionally which over time brings about progressive weight loss.  This can be achieved more effectively by avoiding ultra processed carbohydrates, processed meats, unhealthy fats.    It is critically important to know your numbers: how much calorie you are consuming and how much calorie you need. More importantly, our carbohydrates sources should be unprocessed naturally occurring  complex starch food items.  It is always important to balance nutrition also by  appropriate intake of proteins (mainly plant-based), healthy fats/oils, plenty of fruits and vegetables.   -The American College of Lifestyle Medicine (ACL M) recommends nutrition derived mostly from Whole Food, Plant Predominant Sources example an apple instead of applesauce or apple pie. Eat Plenty of vegetables, Mushrooms, fruits, Legumes, Whole Grains, Nuts, seeds in lieu of processed meats, processed snacks/pastries red meat, poultry, eggs.  Use only water or unsweetened tea for hydration.  The College also recommends the need to stay away from risky substances including alcohol, smoking; obtaining 7-9 hours of restorative sleep, at least 150 minutes of moderate intensity exercise weekly, importance of healthy social connections, and being mindful of stress and seek help when it is overwhelming.    -Sticking to a routine mealtime to eat 3 meals a day and avoiding unnecessary snacks is shown to have a big role in weight control. Under normal circumstances, the only time we burn stored energy is when we are hungry, so allow  some hunger to take place- hunger means no food between appropriate meal times, only water.  It is not advisable to starve.   -It is better to avoid simple carbohydrates including:  Cakes, Sweet Desserts, Ice Cream, Soda (diet and regular), Sweet Tea, Candies, Chips, Cookies, Store Bought Juices, Alcohol in Excess of  1-2 drinks a day, Lemonade,  Artificial Sweeteners, Doughnuts, Coffee Creamers, "Sugar-free" Products, etc, etc.  This is not a complete list.....    -Consulting with certified diabetes educators is proven to provide you with the most accurate and current information on diet.  Also, you may be  interested in discussing diet options/exchanges , we can schedule a visit with Kristi Estrada, RDN, CDE for individualized nutrition education.  -Exercise: If you are able: 30 -60 minutes a day ,4 days a week, or 150 minutes of moderate intensity exercise weekly.    The longer the better if tolerated.  Combine stretch, strength, and aerobic activities.  If you were told in the past that you have high risk for cardiovascular diseases, or if you are currently symptomatic, you may seek evaluation by your heart doctor prior to initiating moderate to intense exercise programs.                                  Additional Care Considerations for Diabetes/Prediabetes   -Diabetes  is a chronic disease.  The most important care consideration is regular follow-up with your diabetes care provider with the goal being avoiding or delaying its complications and to take advantage of advances in medications and technology.  If appropriate actions are taken early enough, type 2 diabetes can even be   reversed.  Seek information from the right source.  - Whole Food, Plant Predominant Nutrition is highly recommended: Eat Plenty of vegetables, Mushrooms, fruits, Legumes, Whole Grains, Nuts, seeds in lieu of processed meats, processed snacks/pastries red meat, poultry, eggs as recommended by American College of  Lifestyle Medicine (ACLM).  -Type 2 diabetes is known to coexist with other important comorbidities such as high blood pressure and high cholesterol.  It is critical to control not only the  diabetes but also the high blood pressure and high cholesterol to minimize and delay the risk of complications including coronary artery disease, stroke, amputations, blindness, etc.  The good news is that this diet recommendation for type 2 diabetes is also very helpful for managing high cholesterol and high blood blood pressure.  - Studies showed that people with diabetes will benefit from a class of medications known as ACE inhibitors and statins.  Unless there are specific reasons not to be on these medications, the standard of care is to consider getting one from these groups of medications at an optimal doses.  These medications are generally considered safe and proven to help protect the heart and the kidneys.    - People with diabetes are encouraged to initiate and maintain regular follow-up with eye doctors, foot doctors, dentists , and if necessary heart and kidney doctors.     - It is highly recommended that people with diabetes quit smoking or stay away from smoking, and get yearly  flu vaccine and pneumonia vaccine at least every 5 years.  See above for additional recommendations on exercise, sleep, stress management , and healthy social connections.      

## 2021-12-08 NOTE — Progress Notes (Signed)
12/08/2021                  Endocrinology follow-up note  Subjective:    Patient ID: Kristi Estrada, female    DOB: 1950/12/26, PCP Christena Flake, MD.   Past Medical History:  Diagnosis Date   Diabetes mellitus type I (HCC)    Hyperthyroidism    History reviewed. No pertinent surgical history. Social History   Socioeconomic History   Marital status: Married    Spouse name: Not on file   Number of children: Not on file   Years of education: Not on file   Highest education level: Not on file  Occupational History   Not on file  Tobacco Use   Smoking status: Never   Smokeless tobacco: Never  Vaping Use   Vaping Use: Never used  Substance and Sexual Activity   Alcohol use: No    Alcohol/week: 0.0 standard drinks of alcohol   Drug use: No   Sexual activity: Not on file  Other Topics Concern   Not on file  Social History Narrative   Not on file   Social Determinants of Health   Financial Resource Strain: Not on file  Food Insecurity: Not on file  Transportation Needs: Not on file  Physical Activity: Not on file  Stress: Not on file  Social Connections: Not on file   Outpatient Encounter Medications as of 12/08/2021  Medication Sig   alendronate (FOSAMAX) 35 MG tablet TAKE 1 TAB BY MOUTH ONCE A WEEK   aspirin EC 81 MG tablet Take by mouth.   B-D ULTRAFINE III SHORT PEN 31G X 8 MM MISC USE SUBCUTANEOUSLY 4 TIMES A DAY   cholecalciferol (VITAMIN D) 1000 units tablet Take 1,000 Units by mouth daily.   Continuous Blood Gluc Receiver (FREESTYLE LIBRE 2 READER) DEVI As directed   Continuous Blood Gluc Sensor (FREESTYLE LIBRE 2 SENSOR) MISC 1 Piece by Does not apply route every 14 (fourteen) days.   escitalopram (LEXAPRO) 10 MG tablet Take 10 mg by mouth daily.   hydrochlorothiazide (HYDRODIURIL) 25 MG tablet Take 25 mg by mouth daily.   Insulin Degludec FlexTouch 100 UNIT/ML SOPN Inject 26 Units into the skin at bedtime.   levothyroxine (SYNTHROID) 88 MCG tablet TAKE 1  TABLET BY MOUTH EVERY DAY BEFORE BREAKFAST   losartan (COZAAR) 50 MG tablet Take by mouth.   mirtazapine (REMERON) 15 MG tablet at bedtime.   Multiple Vitamin (MULTIVITAMIN) tablet Take 1 tablet by mouth daily.   NOVOLOG FLEXPEN 100 UNIT/ML FlexPen Inject 12-15 Units into the skin 3 (three) times daily with meals.   pravastatin (PRAVACHOL) 20 MG tablet    telmisartan (MICARDIS) 80 MG tablet Take 80 mg by mouth daily.   No facility-administered encounter medications on file as of 12/08/2021.   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 -She is currently on levothyroxine 88 mcg p.o. daily before breakfast.  She reports better consistency taking her medications this time.     .-She is also being followed for chronically uncontrolled diabetes now confirmed to be type 1.   During her recent visit she was put on basal/bolus insulin, presenting with better control of her diabetes.  She presents with her  CGM showing 44% time range, 24% level 1 hyperglycemia, 32% level 2 hyperglycemia.  She has no hypoglycemia.  Her point-of-care A1c is 8.6%, increasing from 6.2%.    She still has discoordinated meal/insulin timing.    She was diagnosed with diabetes at approximate age of 16.    The patient has family history of thyroid dysfunction  in her mother and in one of her daughters.  Review of Systems Limited as above.   Objective:    BP (!) 144/82   Pulse 64   Ht 5\' 11"  (1.803 m)   Wt 190 lb (86.2 kg)   BMI 26.50 kg/m   Wt Readings from Last 3 Encounters:  12/08/21 190 lb (86.2 kg)  09/06/21 186 lb 3.2 oz (84.5 kg)  06/09/21 184 lb 12.8 oz (83.8 kg)      Thyroid  ultrasound from 11/16/2015 in [facility] showed: Right lobe 5.3 cm x 1.6 cm x 1.9 cm heterogenous and appearance. Left lobe 4.8 cm x 1.9 cm x 1.4 cm heterogenous with a 3.7 mm solid/hypoechoic nodule.  -  On 09/21/2016: Thyroid uptake and scan showed 50.8% of uniform uptake consistent with Graves' disease. Recent Results (from the past 2160 hour(s))  HgB A1c     Status: Abnormal   Collection Time: 12/08/21 10:00 AM  Result Value Ref Range   Hemoglobin A1C     HbA1c POC (<> result, manual entry)     HbA1c, POC (prediabetic range)     HbA1c, POC (controlled diabetic range) 8.6 (A) 0.0 - 7.0 %     Assessment & Plan:   1. 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 Graves' disease-  Resolved  -During her last visit, her levothyroxine was adjusted at 88 mcg p.o. daily before breakfast.  She is advised to continue on the same dose until next measurement.     - We discussed about the correct intake of her thyroid hormone, on empty stomach at fasting, with water, separated by at least 30 minutes from breakfast and other medications,  and separated by more than 4 hours from calcium, iron, multivitamins, acid reflux medications (PPIs). -Patient is made aware of the fact that thyroid hormone replacement is needed for life, dose to be adjusted by periodic monitoring of thyroid function tests.    Graves' disease has resolved.   3.  Type 1 diabetes Her diabetes is now confirmed to be type 1. She is responding to basal insulin with point-of-care A1c of 8.6%, progressively worsening from 6.2%.   -She will continue to need intensive treatment with basal/bolus insulin in order for her to achieve control of diabetes target.    -She worries about overnight hypoglycemia.  Based on her presentation, she his running hyperglycemia overnight.  She is advised to increase her Tresiba to 30 units nightly, continue NovoLog 12 -15 units 3 times daily AC   for Premeal blood glucose readings  above 90 mg per DL, associated with strict monitoring of blood glucose 4 times a day-before meals and at bedtime.  -She is  encouraged to continue to use her CGM device at all times.   - she acknowledges that there is a room for improvement in her food and drink choices. - Suggestion is made for her to avoid simple carbohydrates  from her diet including Cakes, Sweet Desserts, Ice Cream, Soda (diet and regular), Sweet Tea, Candies, Chips, Cookies, Store Bought Juices, Alcohol in Excess of  1-2 drinks a day, Artificial Sweeteners,  Coffee Creamer, and "Sugar-free" Products,  Lemonade. This will help patient to have more stable blood glucose profile and potentially avoid unintended weight gain.   -She is encouraged to call clinic for hypoglycemia below 70 or greater than 200x3.   4.  Hypertension: Her blood pressure is not controlled to target.    She is advised to continue losartan 50 mg p.o. daily, hydrochlorothiazide 25 mg p.o. daily.  She is advised on salt restrictions.  - I advised patient to maintain close follow up with Christena Flake, MD for her diabetes and primary care needs.    I spent 35 minutes in the care of the patient today including review of labs from CMP, Lipids, Thyroid Function, Hematology (current and previous including abstractions from other facilities); face-to-face time discussing  her blood glucose readings/logs, discussing hypoglycemia and hyperglycemia episodes and symptoms, medications doses, her options of short and long term treatment based on the latest standards of care / guidelines;  discussion about incorporating lifestyle medicine;  and documenting the encounter. Risk reduction counseling performed per USPSTF guidelines to reduce obesity and cardiovascular risk factors.     Please refer to Patient Instructions for Blood Glucose Monitoring and Insulin/Medications Dosing Guide"  in media tab for additional information. Please  also refer to " Patient Self  Inventory" in the Media  tab for reviewed elements of pertinent patient history.  Jannis Atkins participated in the discussions, expressed understanding, and voiced agreement with the above plans.  All questions were answered to her satisfaction. she is encouraged to contact clinic should she have any questions or concerns prior to her return visit.     Follow up plan: Return in about 3 months (around 03/10/2022) for F/U with Pre-visit Labs, Meter/CGM/Logs, A1c here.  Marquis Lunch, MD Phone: [phone]  Fax: [phone]   This note was partially dictated with voice recognition software. Similar sounding words can be transcribed inadequately or may not  be corrected upon review.  12/08/2021, 10:35 AM

## 2022-01-27 ENCOUNTER — Telehealth: Payer: Self-pay | Admitting: "Endocrinology

## 2022-01-27 DIAGNOSIS — E1065 Type 1 diabetes mellitus with hyperglycemia: Secondary | ICD-10-CM

## 2022-01-27 MED ORDER — NOVOLOG FLEXPEN 100 UNIT/ML ~~LOC~~ SOPN: 12.0000 [IU] | PEN_INJECTOR | Freq: Three times a day (TID) | SUBCUTANEOUS | 1 refills | Status: DC

## 2022-01-27 NOTE — Telephone Encounter (Signed)
Rx refill sent.

## 2022-01-27 NOTE — Telephone Encounter (Signed)
New message     1. Which medications need to be refilled? (please list name of each medication and dose if known) NOVOLOG FLEXPEN 100 UNIT/ML FlexPen  2. Which pharmacy/location (including street and city if local pharmacy) is medication to be sent to?[facility] [place]   3. Do they need a 30 day or 90 day supply? 30 day supply

## 2022-01-28 ENCOUNTER — Other Ambulatory Visit: Payer: Self-pay | Admitting: "Endocrinology

## 2022-01-28 DIAGNOSIS — E890 Postprocedural hypothyroidism: Secondary | ICD-10-CM

## 2022-02-21 ENCOUNTER — Telehealth: Payer: Self-pay | Admitting: "Endocrinology

## 2022-02-21 DIAGNOSIS — E1065 Type 1 diabetes mellitus with hyperglycemia: Secondary | ICD-10-CM

## 2022-02-21 NOTE — Telephone Encounter (Signed)
Pt left a VM stating she needs a refill on her [place] [facility] [address] [place] 30 day supply

## 2022-02-22 MED ORDER — INSULIN DEGLUDEC FLEXTOUCH 100 UNIT/ML ~~LOC~~ SOPN: 30.0000 [IU] | PEN_INJECTOR | Freq: Every day | SUBCUTANEOUS | 0 refills | Status: DC

## 2022-02-22 NOTE — Telephone Encounter (Signed)
Rx refill sent to [facility].

## 2022-03-10 ENCOUNTER — Encounter: Payer: Self-pay | Admitting: "Endocrinology

## 2022-03-10 ENCOUNTER — Ambulatory Visit: Payer: Medicare HMO | Admitting: "Endocrinology

## 2022-03-10 VITALS — BP 126/70 | HR 71 | Ht 71.0 in | Wt 192.0 lb

## 2022-03-10 DIAGNOSIS — E1065 Type 1 diabetes mellitus with hyperglycemia: Secondary | ICD-10-CM

## 2022-03-10 DIAGNOSIS — I10 Essential (primary) hypertension: Secondary | ICD-10-CM

## 2022-03-10 DIAGNOSIS — E890 Postprocedural hypothyroidism: Secondary | ICD-10-CM | POA: Diagnosis not present

## 2022-03-10 LAB — POCT GLYCOSYLATED HEMOGLOBIN (HGB A1C): Hemoglobin A1C: 8.3 % — AB (ref 4.0–5.6)

## 2022-03-10 MED ORDER — INSULIN DEGLUDEC FLEXTOUCH 100 UNIT/ML ~~LOC~~ SOPN: 34.0000 [IU] | PEN_INJECTOR | Freq: Every day | SUBCUTANEOUS | 0 refills | Status: DC

## 2022-03-10 NOTE — Progress Notes (Addendum)
03/10/2022                  Endocrinology follow-up note  Subjective:    Patient ID: Kristi Estrada, female    DOB: 1950-05-25, PCP Christena Flake, MD.   Past Medical History:  Diagnosis Date   Diabetes mellitus type I (HCC)    Hyperthyroidism    History reviewed. No pertinent surgical history. Social History   Socioeconomic History   Marital status: Married    Spouse name: Not on file   Number of children: Not on file   Years of education: Not on file   Highest education level: Not on file  Occupational History   Not on file  Tobacco Use   Smoking status: Never   Smokeless tobacco: Never  Vaping Use   Vaping Use: Never used  Substance and Sexual Activity   Alcohol use: No    Alcohol/week: 0.0 standard drinks of alcohol   Drug use: No   Sexual activity: Not on file  Other Topics Concern   Not on file  Social History Narrative   Not on file   Social Determinants of Health   Financial Resource Strain: Not on file  Food Insecurity: Not on file  Transportation Needs: Not on file  Physical Activity: Not on file  Stress: Not on file  Social Connections: Not on file   Outpatient Encounter Medications as of 03/10/2022  Medication Sig   alendronate (FOSAMAX) 35 MG tablet TAKE 1 TAB BY MOUTH ONCE A WEEK   aspirin EC 81 MG tablet Take by mouth.   B-D ULTRAFINE III SHORT PEN 31G X 8 MM MISC USE SUBCUTANEOUSLY 4 TIMES A DAY   cholecalciferol (VITAMIN D) 1000 units tablet Take 1,000 Units by mouth daily.   Continuous Blood Gluc Receiver (FREESTYLE LIBRE 2 READER) DEVI As directed   Continuous Blood Gluc Sensor (FREESTYLE LIBRE 2 SENSOR) MISC 1 Piece by Does not apply route every 14 (fourteen) days.   escitalopram (LEXAPRO) 10 MG tablet Take 10 mg by mouth daily.   hydrochlorothiazide (HYDRODIURIL) 25 MG tablet Take 25 mg by mouth daily.   Insulin Degludec FlexTouch 100 UNIT/ML SOPN Inject 34 Units into the skin at bedtime.   levothyroxine (SYNTHROID) 88 MCG tablet TAKE 1  TABLET BY MOUTH EVERY DAY BEFORE BREAKFAST   losartan (COZAAR) 50 MG tablet Take by mouth.   mirtazapine (REMERON) 15 MG tablet at bedtime.   Multiple Vitamin (MULTIVITAMIN) tablet Take 1 tablet by mouth daily.   NOVOLOG FLEXPEN 100 UNIT/ML FlexPen Inject 12-15 Units into the skin 3 (three) times daily with meals.   pravastatin (PRAVACHOL) 20 MG tablet    telmisartan (MICARDIS) 80 MG tablet Take 80 mg by mouth daily.   [DISCONTINUED] Insulin Degludec FlexTouch 100 UNIT/ML SOPN Inject 30 Units into the skin at bedtime.   No facility-administered encounter medications on file as of 03/10/2022.   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 -She is currently on levothyroxine 88 mcg p.o. daily before breakfast.   She reports better consistency taking her medications this time.     - She is also being followed for chronically uncontrolled diabetes now confirmed to be type 1.   She presents with  better engagement utilizing her CGM and insulin.  Her AGP report shows 46% in range, 28% level 1 hyperglycemia, 24% level 2 hyperglycemia.  She has 2% level 1 hypoglycemia.  Her point-of-care A1c is 8.3%, slowly improving.    During her recent visit she was put on basal/bolus insulin, presenting with better control of her diabetes.  She presents with her CGM showing 44% time range, 24% level 1 hyperglycemia, 32% level 2 hyperglycemia.  She has no hypoglycemia.  Her point-of-care A1c is 8.6%, increasing from 6.2%.    She still has discoordinated meal/insulin timing.    She was diagnosed with diabetes at approximate age of 71.    The patient has family history of thyroid dysfunction  in  her mother and in one of her daughters.  Review of Systems Limited as above.   Objective:    BP 126/70   Pulse 71   Ht 5\' 11"  (1.803 m)   Wt 192 lb (87.1 kg)   BMI 26.78 kg/m   Wt Readings from Last 3 Encounters:  03/10/22 192 lb (87.1 kg)  12/08/21 190 lb (86.2 kg)  09/06/21 186 lb 3.2 oz (84.5 kg)      Thyroid ultrasound from 11/16/2015 in [facility] showed: Right lobe 5.3 cm x 1.6 cm x 1.9 cm heterogenous and appearance. Left lobe 4.8 cm x 1.9 cm x 1.4 cm heterogenous with a 3.7 mm solid/hypoechoic nodule.  -  On 09/21/2016: Thyroid uptake and scan showed 50.8% of uniform uptake consistent with Graves' disease. Recent Results (from the past 2160 hour(s))  HgB A1c     Status: Abnormal   Collection Time: 03/10/22 11:02 AM  Result Value Ref Range   Hemoglobin A1C 8.3 (A) 4.0 - 5.6 %   HbA1c POC (<> result, manual entry)     HbA1c, POC (prediabetic range)     HbA1c, POC (controlled diabetic range)       Assessment & Plan:   1. 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 Graves' disease-  Resolved  -During her last visit, her levothyroxine was adjusted at 88 mcg p.o. daily before breakfast.  She is advised to continue on the same dose until next measurement.    - We discussed about the correct intake of her thyroid hormone, on empty stomach at fasting, with water, separated by at least 30 minutes from breakfast and other medications,  and separated by more than 4 hours from calcium, iron, multivitamins, acid reflux medications (PPIs). -Patient is made aware of the fact that thyroid hormone replacement is needed for life, dose to be adjusted by periodic monitoring of thyroid function tests.    Graves' disease has resolved.   3.  Type 1 diabetes Her diabetes is now confirmed to be type 1.  She presents with better engagement utilizing her CGM and insulin.  Her AGP report shows 46% in range, 28% level 1 hyperglycemia, 24% level 2 hyperglycemia.  She has  2% level 1 hypoglycemia.  Her point-of-care A1c is 8.3%, slowly improving.    -She will continue to need intensive treatment with basal/bolus insulin in order for her to achieve control of diabetes target.    -She worries about overnight hypoglycemia.  Based on her presentation, she his running hyperglycemia overnight.  She is advised to increase her Tresiba to 34 units nightly, advised to continue NovoLog 12 -15 units 3 times daily AC   for Premeal blood glucose readings above 90 mg per DL, associated with strict monitoring of blood glucose 4  times a day-before meals and at bedtime.  -She is  encouraged to continue to use her CGM device at all times.   - she acknowledges that there is a room for improvement in her food and drink choices. - Suggestion is made for her to avoid simple carbohydrates  from her diet including Cakes, Sweet Desserts, Ice Cream, Soda (diet and regular), Sweet Tea, Candies, Chips, Cookies, Store Bought Juices, Alcohol in Excess of  1-2 drinks a day, Artificial Sweeteners,  Coffee Creamer, and "Sugar-free" Products, Lemonade. This will help patient to have more stable blood glucose profile and potentially avoid unintended weight gain.   -She is encouraged to call clinic for hypoglycemia below 70 or greater than 200x3.   4.  Hypertension: Her blood pressure is controlled to target.    She is advised to continue losartan 50 mg p.o. daily, hydrochlorothiazide 25 mg p.o. daily.  She is advised on salt restrictions.  - I advised patient to maintain close follow up with Olena Mater, MD for her diabetes and primary care needs.   I spent 32 minutes in the care of the patient today including review of labs from [place], Lipids, Thyroid Function, Hematology (current and previous including abstractions from other facilities); face-to-face time discussing  her blood glucose readings/logs, discussing hypoglycemia and hyperglycemia episodes and symptoms, medications doses, her options  of short and long term treatment based on the latest standards of care / guidelines;  discussion about incorporating lifestyle medicine;  and documenting the encounter. Risk reduction counseling performed per USPSTF guidelines to reduce  cardiovascular risk factors.     Please refer to Patient Instructions for Blood Glucose Monitoring and Insulin/Medications Dosing Guide"  in media tab for additional information. Please  also refer to " Patient Self Inventory" in the Media  tab for reviewed elements of pertinent patient history.  Malajah Oceguera participated in the discussions, expressed understanding, and voiced agreement with the above plans.  All questions were answered to her satisfaction. she is encouraged to contact clinic should she have any questions or concerns prior to her return visit.      Follow up plan: Return in about 3 months (around 06/10/2022) for Bring Meter/CGM Device/Logs- A1c in Office.  Glade Lloyd, MD Phone: [phone]  Fax: [phone]   This note was partially dictated with voice recognition software. Similar sounding words can be transcribed inadequately or may not  be corrected upon review.  03/10/2022, 2:48 PM

## 2022-03-22 ENCOUNTER — Other Ambulatory Visit: Payer: Self-pay | Admitting: "Endocrinology

## 2022-03-22 DIAGNOSIS — E890 Postprocedural hypothyroidism: Secondary | ICD-10-CM

## 2022-03-22 DIAGNOSIS — E1065 Type 1 diabetes mellitus with hyperglycemia: Secondary | ICD-10-CM

## 2022-04-08 ENCOUNTER — Other Ambulatory Visit: Payer: Self-pay | Admitting: *Deleted

## 2022-04-08 ENCOUNTER — Telehealth: Payer: Self-pay | Admitting: "Endocrinology

## 2022-04-08 DIAGNOSIS — E1065 Type 1 diabetes mellitus with hyperglycemia: Secondary | ICD-10-CM

## 2022-04-08 MED ORDER — BD PEN NEEDLE SHORT U/F 31G X 8 MM MISC: 3 refills | Status: DC

## 2022-04-08 NOTE — Telephone Encounter (Signed)
Rx was sent  

## 2022-04-08 NOTE — Telephone Encounter (Signed)
Patient left a VM  on Joy's phone stating that she needs a refill on her pen needles to [facility] on [address].

## 2022-04-09 ENCOUNTER — Other Ambulatory Visit: Payer: Self-pay | Admitting: "Endocrinology

## 2022-04-09 DIAGNOSIS — E1065 Type 1 diabetes mellitus with hyperglycemia: Secondary | ICD-10-CM

## 2022-05-29 ENCOUNTER — Other Ambulatory Visit: Payer: Self-pay | Admitting: "Endocrinology

## 2022-05-29 DIAGNOSIS — E890 Postprocedural hypothyroidism: Secondary | ICD-10-CM

## 2022-06-10 ENCOUNTER — Encounter: Payer: Self-pay | Admitting: "Endocrinology

## 2022-06-10 ENCOUNTER — Ambulatory Visit: Payer: Medicare HMO | Admitting: "Endocrinology

## 2022-06-10 VITALS — BP 134/76 | HR 64 | Ht 71.0 in | Wt 194.6 lb

## 2022-06-10 DIAGNOSIS — E890 Postprocedural hypothyroidism: Secondary | ICD-10-CM

## 2022-06-10 DIAGNOSIS — I10 Essential (primary) hypertension: Secondary | ICD-10-CM

## 2022-06-10 DIAGNOSIS — E1065 Type 1 diabetes mellitus with hyperglycemia: Secondary | ICD-10-CM | POA: Diagnosis not present

## 2022-06-10 LAB — POCT GLYCOSYLATED HEMOGLOBIN (HGB A1C): HbA1c, POC (controlled diabetic range): 8.1 % — AB (ref 0.0–7.0)

## 2022-06-10 MED ORDER — TRESIBA FLEXTOUCH 100 UNIT/ML ~~LOC~~ SOPN: 38.0000 [IU] | PEN_INJECTOR | Freq: Every day | SUBCUTANEOUS | 1 refills | Status: DC

## 2022-06-10 MED ORDER — FREESTYLE LIBRE 2 SENSOR MISC: 1.0000 | 2 refills | Status: DC

## 2022-06-10 NOTE — Progress Notes (Unsigned)
06/10/2022                  Endocrinology follow-up note  Subjective:    Patient ID: Kristi Estrada Agee, female    DOB: 1951/03/30, PCP Olena Mater, MD.   Past Medical History:  Diagnosis Date   Diabetes mellitus type I ([place])    Hyperthyroidism    History reviewed. No pertinent surgical history. Social History   Socioeconomic History   Marital status: Married    Spouse name: Not on file   Number of children: Not on file   Years of education: Not on file   Highest education level: Not on file  Occupational History   Not on file  Tobacco Use   Smoking status: Never   Smokeless tobacco: Never  Vaping Use   Vaping Use: Never used  Substance and Sexual Activity   Alcohol use: No    Alcohol/week: 0.0 standard drinks of alcohol   Drug use: No   Sexual activity: Not on file  Other Topics Concern   Not on file  Social History Narrative   Not on file   Social Determinants of Health   Financial Resource Strain: Not on file  Food Insecurity: Not on file  Transportation Needs: Not on file  Physical Activity: Not on file  Stress: Not on file  Social Connections: Not on file   Outpatient Encounter Medications as of 06/10/2022  Medication Sig   citalopram (CELEXA) 20 MG tablet Take 20 mg by mouth every morning.   metoprolol succinate (TOPROL-XL) 50 MG 24 hr tablet Take 50 mg by mouth daily.   alendronate (FOSAMAX) 35 MG tablet TAKE 1 TAB BY MOUTH ONCE A WEEK   B-D ULTRAFINE III SHORT PEN 31G X 8 MM MISC USE SUBCUTANEOUSLY 4 TIMES A DAY   cholecalciferol (VITAMIN D) 1000 units tablet Take 1,000 Units by mouth daily.   Continuous Blood Gluc Receiver (FREESTYLE LIBRE 2 READER) DEVI As directed   Continuous Blood Gluc Sensor (FREESTYLE LIBRE 2 SENSOR) MISC 1 Piece by Does not apply route every 14 (fourteen) days.   hydrochlorothiazide (HYDRODIURIL) 25 MG tablet Take 25 mg by mouth daily.   insulin degludec (TRESIBA FLEXTOUCH) 100 UNIT/ML FlexTouch Pen Inject 38 Units into the skin at  bedtime.   levothyroxine (SYNTHROID) 88 MCG tablet TAKE 1 TABLET BY MOUTH EVERY DAY BEFORE BREAKFAST   losartan (COZAAR) 50 MG tablet Take by mouth.   mirtazapine (REMERON) 15 MG tablet at bedtime.   Multiple Vitamin (MULTIVITAMIN) tablet Take 1 tablet by mouth daily.   NOVOLOG FLEXPEN 100 UNIT/ML FlexPen INJECT 12-15 UNITS INTO THE SKIN 3 (THREE) TIMES DAILY WITH MEALS.   pravastatin (PRAVACHOL) 20 MG tablet    telmisartan (MICARDIS) 80 MG tablet Take 80 mg by mouth daily.   [DISCONTINUED] aspirin EC 81 MG tablet Take by mouth.   [DISCONTINUED] Continuous Blood Gluc Sensor (FREESTYLE LIBRE 2 SENSOR) MISC 1 Piece by Does not apply route every 14 (fourteen) days.   [DISCONTINUED] escitalopram (LEXAPRO) 10 MG tablet Take 10 mg by mouth daily.   [DISCONTINUED] insulin degludec (TRESIBA FLEXTOUCH) 100 UNIT/ML FlexTouch Pen Inject 34 Units into the skin at bedtime.   No facility-administered encounter medications on file as of 06/10/2022.   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 -She is currently on levothyroxine 88 mcg p.o. daily before breakfast.   She reports better consistency taking her medications this time.     - She is also being followed for chronically uncontrolled diabetes now confirmed to be type 1.   She presents with better engagement utilizing her CGM and insulin.  Her AGP report shows 46% in range, 28% level 1 hyperglycemia, 24% level 2 hyperglycemia.  She has 2% level 1 hypoglycemia.  Her point-of-care A1c is 8.3%, slowly improving.    During her recent visit she was put on basal/bolus insulin,  presenting with better control of her diabetes.  She presents with her CGM showing 44% time range, 24% level 1 hyperglycemia, 32% level 2 hyperglycemia.  She has no hypoglycemia.  Her point-of-care A1c is 8.6%, increasing from 6.2%.    She still has discoordinated meal/insulin timing.    She was diagnosed with diabetes at approximate age of 65.    The patient has family history of thyroid dysfunction  in her mother and in one of her daughters.  Review of Systems Limited as above.   Objective:    BP 134/76   Pulse 64   Ht 5\' 11"  (1.803 m)   Wt 194 lb 9.6 oz (88.3 kg)   BMI 27.14 kg/m   Wt Readings from Last 3 Encounters:  06/10/22 194 lb 9.6 oz (88.3 kg)  03/10/22 192 lb (87.1 kg)  12/08/21 190 lb (86.2 kg)      Thyroid ultrasound from 11/16/2015 in [facility] showed: Right lobe 5.3 cm x 1.6 cm x 1.9 cm heterogenous and appearance. Left lobe 4.8 cm x 1.9 cm x 1.4 cm heterogenous with a 3.7 mm solid/hypoechoic nodule.  -  On 09/21/2016: Thyroid uptake and scan showed 50.8% of uniform uptake consistent with Graves' disease. 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 Graves' disease-  Resolved  -During her last visit, her levothyroxine was adjusted at 88 mcg p.o. daily before breakfast.  She is advised to continue on the same dose until next measurement.    - We discussed about the correct intake of her thyroid hormone, on empty stomach at fasting, with water, separated by at least 30 minutes from breakfast and other medications,  and separated by more than 4 hours from calcium, iron, multivitamins, acid reflux medications (PPIs). -Patient  is made aware of the fact that thyroid hormone replacement is needed for life, dose to be adjusted by periodic monitoring of thyroid function tests.    Graves' disease has resolved.   3.  Type 1 diabetes Her diabetes is now confirmed to be type 1.  She presents with better engagement utilizing her CGM and insulin.  Her AGP report shows 46% in range, 28% level 1 hyperglycemia, 24% level 2 hyperglycemia.  She has 2% level 1 hypoglycemia.  Her point-of-care A1c is 8.3%, slowly improving.    -She will continue to need intensive treatment with basal/bolus insulin  in order for her to achieve control of diabetes target.    -She worries about overnight hypoglycemia.  Based on her presentation, she his running hyperglycemia overnight.  She is advised to increase her Tresiba to 34 units nightly, advised to continue NovoLog 12 -15 units 3 times daily AC   for Premeal blood glucose readings above 90 mg per DL, associated with strict monitoring of blood glucose 4 times a day-before meals and at bedtime.  -She is  encouraged to continue to use her CGM device at all times.   - she acknowledges that there is a room for improvement in her food and drink choices. - Suggestion is made for her to avoid simple carbohydrates  from her diet including Cakes, Sweet Desserts, Ice Cream, Soda (diet and regular), Sweet Tea, Candies, Chips, Cookies, Store Bought Juices, Alcohol in Excess of  1-2 drinks a day, Artificial Sweeteners,  Coffee Creamer, and "Sugar-free" Products, Lemonade. This will help patient to have more stable blood glucose profile and potentially avoid unintended weight gain.   -She is encouraged to call clinic for hypoglycemia below 70 or greater than 200x3.   4.  Hypertension: Her blood pressure is controlled to target.    She is advised to continue losartan 50 mg p.o. daily, hydrochlorothiazide 25 mg p.o. daily.  She is advised on salt restrictions.  - I advised patient to maintain close follow up  with Olena Mater, MD for her diabetes and primary care needs.   I spent 32 minutes in the care of the patient today including review of labs from [place], Lipids, Thyroid Function, Hematology (current and previous including abstractions from other facilities); face-to-face time discussing  her blood glucose readings/logs, discussing hypoglycemia and hyperglycemia episodes and symptoms, medications doses, her options of short and long term treatment based on the latest standards of care / guidelines;  discussion about incorporating lifestyle medicine;  and documenting the encounter. Risk reduction counseling performed per USPSTF guidelines to reduce  cardiovascular risk factors.     Please refer to Patient Instructions for Blood Glucose Monitoring and Insulin/Medications Dosing Guide"  in media tab for additional information. Please  also refer to " Patient Self Inventory" in the Media  tab for reviewed elements of pertinent patient history.  Jaylianna Tatlock participated in the discussions, expressed understanding, and voiced agreement with the above plans.  All questions were answered to her satisfaction. she is encouraged to contact clinic should she have any questions or concerns prior to her return visit.      Follow up plan: Return in about 3 months (around 09/08/2022) for F/U with Pre-visit Labs, Meter/CGM/Logs, A1c here.  Glade Lloyd, MD Phone: [phone]  Fax: [phone]   This note was partially dictated with voice recognition software. Similar sounding words can be transcribed inadequately or may not  be corrected upon review.  06/10/2022, 12:23 PM

## 2022-06-10 NOTE — Patient Instructions (Signed)

## 2022-06-13 ENCOUNTER — Telehealth: Payer: Self-pay

## 2022-06-13 NOTE — Telephone Encounter (Signed)
Order for Libre 2 CGM system sent to [facility]. 

## 2022-07-19 ENCOUNTER — Other Ambulatory Visit: Payer: Self-pay | Admitting: "Endocrinology

## 2022-07-19 DIAGNOSIS — E1065 Type 1 diabetes mellitus with hyperglycemia: Secondary | ICD-10-CM

## 2022-09-15 ENCOUNTER — Encounter: Payer: Self-pay | Admitting: "Endocrinology

## 2022-09-15 ENCOUNTER — Ambulatory Visit: Payer: Medicare HMO | Admitting: "Endocrinology

## 2022-09-15 VITALS — BP 138/74 | HR 76 | Ht 71.0 in | Wt 196.6 lb

## 2022-09-15 DIAGNOSIS — E890 Postprocedural hypothyroidism: Secondary | ICD-10-CM

## 2022-09-15 DIAGNOSIS — E1065 Type 1 diabetes mellitus with hyperglycemia: Secondary | ICD-10-CM | POA: Diagnosis not present

## 2022-09-15 DIAGNOSIS — I10 Essential (primary) hypertension: Secondary | ICD-10-CM

## 2022-09-15 LAB — POCT GLYCOSYLATED HEMOGLOBIN (HGB A1C): HbA1c, POC (controlled diabetic range): 8 % — AB (ref 0.0–7.0)

## 2022-09-15 MED ORDER — LEVOTHYROXINE SODIUM 100 MCG PO TABS
100.0000 ug | ORAL_TABLET | Freq: Every day | ORAL | 1 refills | Status: DC
Start: 2022-09-15 — End: 2022-09-23

## 2022-09-15 NOTE — Progress Notes (Signed)
09/15/2022                  Endocrinology follow-up note  Subjective:    Patient ID: Kristi Estrada, female    DOB: 01-03-1951, PCP Kristi Flake, MD.   Past Medical History:  Diagnosis Date   Diabetes mellitus type I (HCC)    Hyperthyroidism    History reviewed. No pertinent surgical history. Social History   Socioeconomic History   Marital status: Married    Spouse name: Not on file   Number of children: Not on file   Years of education: Not on file   Highest education level: Not on file  Occupational History   Not on file  Tobacco Use   Smoking status: Never   Smokeless tobacco: Never  Vaping Use   Vaping Use: Never used  Substance and Sexual Activity   Alcohol use: No    Alcohol/week: 0.0 standard drinks of alcohol   Drug use: No   Sexual activity: Not on file  Other Topics Concern   Not on file  Social History Narrative   Not on file   Social Determinants of Health   Financial Resource Strain: Not on file  Food Insecurity: Not on file  Transportation Needs: Not on file  Physical Activity: Not on file  Stress: Not on file  Social Connections: Not on file   Outpatient Encounter Medications as of 09/15/2022  Medication Sig   alendronate (FOSAMAX) 35 MG tablet TAKE 1 TAB BY MOUTH ONCE A WEEK   B-D ULTRAFINE III SHORT PEN 31G X 8 MM MISC USE SUBCUTANEOUSLY 4 TIMES A DAY   cholecalciferol (VITAMIN D) 1000 units tablet Take 1,000 Units by mouth daily.   citalopram (CELEXA) 20 MG tablet Take 20 mg by mouth every morning.   Continuous Blood Gluc Receiver (FREESTYLE LIBRE 2 READER) DEVI As directed   Continuous Blood Gluc Sensor (FREESTYLE LIBRE 2 SENSOR) MISC 1 Piece by Does not apply route every 14 (fourteen) days.   hydrochlorothiazide (HYDRODIURIL) 25 MG tablet Take 25 mg by mouth daily.   insulin degludec (TRESIBA FLEXTOUCH) 100 UNIT/ML FlexTouch Pen Inject 38 Units into the skin at bedtime.   levothyroxine (SYNTHROID) 100 MCG tablet Take 1 tablet (100 mcg  total) by mouth daily before breakfast.   losartan (COZAAR) 50 MG tablet Take by mouth.   metoprolol succinate (TOPROL-XL) 50 MG 24 hr tablet Take 50 mg by mouth daily.   mirtazapine (REMERON) 15 MG tablet at bedtime.   Multiple Vitamin (MULTIVITAMIN) tablet Take 1 tablet by mouth daily.   NOVOLOG FLEXPEN 100 UNIT/ML FlexPen INJECT 12-15 UNITS INTO THE SKIN 3 (THREE) TIMES DAILY WITH MEALS.   pravastatin (PRAVACHOL) 20 MG tablet    telmisartan (MICARDIS) 80 MG tablet Take 80 mg by mouth daily.   [DISCONTINUED] levothyroxine (SYNTHROID) 88 MCG tablet TAKE 1 TABLET BY MOUTH EVERY DAY BEFORE BREAKFAST   No facility-administered encounter medications on file as of 09/15/2022.   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 -She is currently on levothyroxine 88 mcg p.o. daily before breakfast.   She reports better consistency taking her medications this time.     -  She is also being followed for chronically uncontrolled diabetes now confirmed to be type 1.   She presents with better engagement utilizing her CGM and insulin.  Her AGP report shows 42% in range, 29% level 1 hyperglycemia, 28% level 2 hyperglycemia.   Her pre-visit labs show A1c unchanged at 8%.   She remains on basal/bolus insulin.  She has no hypoglycemia.   She still has discoordinated meal/insulin timing.    She was diagnosed with diabetes at approximate age of 71.    The patient has family history of thyroid dysfunction  in her mother and in one of her daughters.  Review of Systems Limited as above.   Objective:    BP 138/74   Pulse 76    Ht 5\' 11"  (1.803 m)   Wt 196 lb 9.6 oz (89.2 kg)   BMI 27.42 kg/m   Wt Readings from Last 3 Encounters:  09/15/22 196 lb 9.6 oz (89.2 kg)  06/10/22 194 lb 9.6 oz (88.3 kg)  03/10/22 192 lb (87.1 kg)      Thyroid ultrasound from 11/16/2015 in [facility] showed: Right lobe 5.3 cm x 1.6 cm x 1.9 cm heterogenous and appearance. Left lobe 4.8 cm x 1.9 cm x 1.4 cm heterogenous with a 3.7 mm solid/hypoechoic nodule.  -  On 09/21/2016: Thyroid uptake and scan showed 50.8% of uniform uptake consistent with Graves' disease.  Recent Results (from the past 2160 hour(s))  HgB A1c     Status: Abnormal   Collection Time: 09/15/22  9:11 AM  Result Value Ref Range   Hemoglobin A1C     HbA1c POC (<> result, manual entry)     HbA1c, POC (prediabetic range)     HbA1c, POC (controlled diabetic range) 8.0 (A) 0.0 - 7.0 %     Assessment & Plan:   1. 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 Graves' disease-  Resolved  -During her last visit, her levothyroxine was adjusted at 88 mcg p.o. daily before breakfast.    Her previsit thyroid function tests are consistent with under replacement.  I discussed and increase her levothyroxine to 100 mcg.  Daily before breakfast.   - We discussed about the correct intake of her thyroid hormone, on empty stomach at fasting, with water, separated by at least 30 minutes from breakfast and other medications,  and separated by more than 4 hours from calcium, iron, multivitamins, acid reflux medications (PPIs). -Patient is made aware of the fact that thyroid hormone replacement is needed for life, dose to be adjusted by periodic monitoring of thyroid function tests.   3.  Type 1 diabetes Her diabetes is now confirmed to be type 1.  She presents with better engagement utilizing her CGM and insulin.  Her AGP report shows 42% in range, 29% level 1 hyperglycemia, 28% level 2 hyperglycemia.   Her pre-visit labs show A1c unchanged at 8%. Her average  blood glucose is 209.   -She will continue to need intensive treatment with basal/bolus insulin in order for her to achieve control of diabetes target.    -She worries about overnight hypoglycemia.  Based on her glycemic presentation, she is advised not to lower her [place].  She is advised to continue Tresiba 38 units nightly, continue NovoLog 12 -15 units 3 times daily AC   for Premeal blood glucose readings above 90 mg per DL, associated with strict monitoring of blood glucose 4 times a day-before meals and at bedtime.  -She is  encouraged to continue  to use her CGM device at all times.   - she acknowledges that there is a room for improvement in her food and drink choices.  - Suggestion is made for her to avoid simple carbohydrates  from her diet including Cakes, Sweet Desserts, Ice Cream, Soda (diet and regular), Sweet Tea, Candies, Chips, Cookies, Store Bought Juices, Alcohol in Excess of  1-2 drinks a day, Artificial Sweeteners,  Coffee Creamer, and "Sugar-free" Products, Lemonade. This will help patient to have more stable blood glucose profile and potentially avoid unintended weight gain.  -She is encouraged to call clinic for hypoglycemia below 70 or greater than 200x3.   4.  Hypertension:  Her blood pressure is controlled to target.   She is advised to continue losartan 50 mg p.o. daily, hydrochlorothiazide 25 mg p.o. daily.  She is advised on salt restrictions.  Her diabetes foot exam is normal except for bilateral bunions.  - I advised patient to maintain close follow up with Kristi Flake, MD for her diabetes and primary care needs.    I spent  26  minutes in the care of the patient today including review of labs from CMP, Lipids, Thyroid Function, Hematology (current and previous including abstractions from other facilities); face-to-face time discussing  her blood glucose readings/logs, discussing hypoglycemia and hyperglycemia episodes and symptoms, medications doses, her  options of short and long term treatment based on the latest standards of care / guidelines;  discussion about incorporating lifestyle medicine;  and documenting the encounter. Risk reduction counseling performed per USPSTF guidelines to reduce cardiovascular risk factors.     Please refer to Patient Instructions for Blood Glucose Monitoring and Insulin/Medications Dosing Guide"  in media tab for additional information. Please  also refer to " Patient Self Inventory" in the Media  tab for reviewed elements of pertinent patient history.  Tekela Parrilli participated in the discussions, expressed understanding, and voiced agreement with the above plans.  All questions were answered to her satisfaction. she is encouraged to contact clinic should she have any questions or concerns prior to her return visit.   Follow up plan: Return in about 4 months (around 01/16/2023) for F/U with Pre-visit Labs, Meter/CGM/Logs, A1c here.  Marquis Lunch, MD Phone: [phone]  Fax: [phone]   This note was partially dictated with voice recognition software. Similar sounding words can be transcribed inadequately or may not  be corrected upon review.  09/15/2022, 5:15 PM

## 2022-09-15 NOTE — Patient Instructions (Signed)

## 2022-09-23 ENCOUNTER — Other Ambulatory Visit: Payer: Self-pay | Admitting: "Endocrinology

## 2022-09-23 DIAGNOSIS — E890 Postprocedural hypothyroidism: Secondary | ICD-10-CM

## 2022-09-23 DIAGNOSIS — E1065 Type 1 diabetes mellitus with hyperglycemia: Secondary | ICD-10-CM

## 2022-11-07 ENCOUNTER — Other Ambulatory Visit: Payer: Self-pay | Admitting: "Endocrinology

## 2022-11-07 DIAGNOSIS — E1065 Type 1 diabetes mellitus with hyperglycemia: Secondary | ICD-10-CM

## 2022-11-17 ENCOUNTER — Other Ambulatory Visit: Payer: Self-pay | Admitting: "Endocrinology

## 2022-11-17 DIAGNOSIS — E1065 Type 1 diabetes mellitus with hyperglycemia: Secondary | ICD-10-CM

## 2022-12-21 ENCOUNTER — Other Ambulatory Visit: Payer: Self-pay | Admitting: "Endocrinology

## 2022-12-21 DIAGNOSIS — E890 Postprocedural hypothyroidism: Secondary | ICD-10-CM

## 2022-12-23 ENCOUNTER — Other Ambulatory Visit: Payer: Self-pay | Admitting: "Endocrinology

## 2022-12-23 DIAGNOSIS — E890 Postprocedural hypothyroidism: Secondary | ICD-10-CM

## 2023-01-24 ENCOUNTER — Encounter: Payer: Self-pay | Admitting: "Endocrinology

## 2023-01-24 ENCOUNTER — Ambulatory Visit: Payer: Medicare HMO | Admitting: "Endocrinology

## 2023-01-24 VITALS — BP 136/80 | HR 64 | Ht 71.0 in | Wt 195.6 lb

## 2023-01-24 DIAGNOSIS — I10 Essential (primary) hypertension: Secondary | ICD-10-CM | POA: Diagnosis not present

## 2023-01-24 DIAGNOSIS — Z794 Long term (current) use of insulin: Secondary | ICD-10-CM | POA: Diagnosis not present

## 2023-01-24 DIAGNOSIS — E1065 Type 1 diabetes mellitus with hyperglycemia: Secondary | ICD-10-CM | POA: Diagnosis not present

## 2023-01-24 DIAGNOSIS — E890 Postprocedural hypothyroidism: Secondary | ICD-10-CM | POA: Diagnosis not present

## 2023-01-24 LAB — POCT GLYCOSYLATED HEMOGLOBIN (HGB A1C): HbA1c, POC (controlled diabetic range): 8.3 % — AB (ref 0.0–7.0)

## 2023-01-24 MED ORDER — TRESIBA FLEXTOUCH 100 UNIT/ML ~~LOC~~ SOPN
40.0000 [IU] | PEN_INJECTOR | Freq: Every day | SUBCUTANEOUS | 2 refills | Status: DC
Start: 2023-01-24 — End: 2023-04-13

## 2023-01-24 NOTE — Patient Instructions (Signed)

## 2023-01-24 NOTE — Progress Notes (Signed)
01/24/2023                  Endocrinology follow-up note  Subjective:    Patient ID: Kristi Estrada, female    DOB: 05/29/1950, PCP Christena Flake, MD.   Past Medical History:  Diagnosis Date   Diabetes mellitus type I (HCC)    Hyperthyroidism    History reviewed. No pertinent surgical history. Social History   Socioeconomic History   Marital status: Married    Spouse name: Not on file   Number of children: Not on file   Years of education: Not on file   Highest education level: Not on file  Occupational History   Not on file  Tobacco Use   Smoking status: Never   Smokeless tobacco: Never  Vaping Use   Vaping status: Never Used  Substance and Sexual Activity   Alcohol use: No    Alcohol/week: 0.0 standard drinks of alcohol   Drug use: No   Sexual activity: Not on file  Other Topics Concern   Not on file  Social History Narrative   Not on file   Social Determinants of Health   Financial Resource Strain: Not on file  Food Insecurity: Not on file  Transportation Needs: Not on file  Physical Activity: Not on file  Stress: Not on file  Social Connections: Not on file   Outpatient Encounter Medications as of 01/24/2023  Medication Sig   alendronate (FOSAMAX) 35 MG tablet TAKE 1 TAB BY MOUTH ONCE A WEEK   B-D ULTRAFINE III SHORT PEN 31G X 8 MM MISC USE SUBCUTANEOUSLY 4 TIMES A DAY   cholecalciferol (VITAMIN D) 1000 units tablet Take 1,000 Units by mouth daily.   citalopram (CELEXA) 20 MG tablet Take 20 mg by mouth every morning.   Continuous Blood Gluc Receiver (FREESTYLE LIBRE 2 READER) DEVI As directed   Continuous Blood Gluc Sensor (FREESTYLE LIBRE 2 SENSOR) MISC 1 Piece by Does not apply route every 14 (fourteen) days.   hydrochlorothiazide (HYDRODIURIL) 25 MG tablet Take 25 mg by mouth daily.   insulin degludec (TRESIBA FLEXTOUCH) 100 UNIT/ML FlexTouch Pen Inject 40 Units into the skin at bedtime.   levothyroxine (SYNTHROID) 100 MCG tablet TAKE 1 TABLET BY MOUTH  DAILY BEFORE BREAKFAST.   losartan (COZAAR) 50 MG tablet Take by mouth.   metoprolol succinate (TOPROL-XL) 50 MG 24 hr tablet Take 50 mg by mouth daily.   mirtazapine (REMERON) 15 MG tablet at bedtime.   Multiple Vitamin (MULTIVITAMIN) tablet Take 1 tablet by mouth daily.   NOVOLOG FLEXPEN 100 UNIT/ML FlexPen INJECT 12-15 UNITS INTO THE SKIN 3 (THREE) TIMES DAILY WITH MEALS.   pravastatin (PRAVACHOL) 20 MG tablet    telmisartan (MICARDIS) 80 MG tablet Take 80 mg by mouth daily.   [DISCONTINUED] insulin degludec (TRESIBA FLEXTOUCH) 100 UNIT/ML FlexTouch Pen Inject 38 Units into the skin at bedtime.   No facility-administered encounter medications on file as of 01/24/2023.   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 -She is currently on levothyroxine 100 mcg p.o. daily before breakfast.    - She is also being followed for chronically uncontrolled diabetes now confirmed to  be type 1.   She presents with suboptimal engagement for her multiple daily injections of insulin to treat her type 1 diabetes.  Her AGP report shows 36% time in range, 27% level 1 hyperglycemia, 36% level 2 hyperglycemia.  Hyperglycemia mostly happens overnight.  Her point-of-care A1c is 8.3% increasing from 8% during her last visit.  For the most recent 2 weeks, her average blood glucose has been 219 mg per DL.   She remains on basal/bolus insulin.  She has no hypoglycemia.   She still has discoordinated meal/insulin timing.    She was diagnosed with diabetes at approximate age of 14.    The patient has family history of thyroid dysfunction   in her mother and in one of her daughters.  Review of Systems Limited as above.   Objective:    BP 136/80   Pulse 64   Ht 5\' 11"  (1.803 m)   Wt 195 lb 9.6 oz (88.7 kg)   BMI 27.28 kg/m   Wt Readings from Last 3 Encounters:  01/24/23 195 lb 9.6 oz (88.7 kg)  09/15/22 196 lb 9.6 oz (89.2 kg)  06/10/22 194 lb 9.6 oz (88.3 kg)      Thyroid ultrasound from 11/16/2015 in [facility] showed: Right lobe 5.3 cm x 1.6 cm x 1.9 cm heterogenous and appearance. Left lobe 4.8 cm x 1.9 cm x 1.4 cm heterogenous with a 3.7 mm solid/hypoechoic nodule.  -  On 09/21/2016: Thyroid uptake and scan showed 50.8% of uniform uptake consistent with Graves' disease.  Recent Results (from the past 2160 hour(s))  HgB A1c     Status: Abnormal   Collection Time: 01/24/23 11:53 AM  Result Value Ref Range   Hemoglobin A1C     HbA1c POC (<> result, manual entry)     HbA1c, POC (prediabetic range)     HbA1c, POC (controlled diabetic range) 8.3 (A) 0.0 - 7.0 %     Assessment & Plan:   1. 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 Graves' disease-  Resolved  She is advised to continue levothyroxine 100 mcg p.o. daily before breakfast.   - We discussed about the correct intake of her thyroid hormone, on empty stomach at fasting, with water, separated by at least 30 minutes from breakfast and other medications,  and separated by more than 4 hours from calcium, iron, multivitamins, acid reflux medications (PPIs). -Patient is made aware of the fact that thyroid hormone replacement is needed for life, dose to be adjusted by periodic monitoring of thyroid function tests.   3.  Type 1 diabetes Her diabetes is now confirmed to be type 1.  She presents with suboptimal engagement.  See her AGP reports above.    -She will continue to need intensive treatment with basal/bolus insulin in order for her to achieve control of diabetes target.    -She worries about overnight hypoglycemia.  Based  on her presentation, she is advised to increase her Tresiba to 40 units nightly, continue her NovoLog 12 -15 units 3 times daily AC   for Premeal blood glucose readings above 90 mg per DL, associated with strict monitoring of blood glucose 4 times a day-before meals and at bedtime.  -She is  encouraged to continue to use her CGM device at all times.   - she acknowledges that there is a room for improvement in her food and drink choices.  - Suggestion is made for her to avoid simple carbohydrates  from her diet including Cakes, Sweet  Desserts, Ice Cream, Soda (diet and regular), Sweet Tea, Candies, Chips, Cookies, Store Bought Juices, Alcohol in Excess of  1-2 drinks a day, Artificial Sweeteners,  Coffee Creamer, and "Sugar-free" Products, Lemonade. This will help patient to have more stable blood glucose profile and potentially avoid unintended weight gain.  -She is encouraged to call clinic for hypoglycemia below 70 or greater than 200x3.   4.  Hypertension:  -Her blood pressure is controlled to target.   She is advised to continue losartan 50 mg p.o. daily, hydrochlorothiazide 25 mg p.o. daily.  She is advised on salt restrictions.  Her diabetes foot exam is normal except for bilateral bunions.  - I advised patient to maintain close follow up with Christena Flake, MD for her diabetes and primary care needs.   I spent  26  minutes in the care of the patient today including review of labs from CMP, Lipids, Thyroid Function, Hematology (current and previous including abstractions from other facilities); face-to-face time discussing  her blood glucose readings/logs, discussing hypoglycemia and hyperglycemia episodes and symptoms, medications doses, her options of short and long term treatment based on the latest standards of care / guidelines;  discussion about incorporating lifestyle medicine;  and documenting the encounter. Risk reduction counseling performed per USPSTF guidelines to reduce   obesity and cardiovascular risk factors.     Please refer to Patient Instructions for Blood Glucose Monitoring and Insulin/Medications Dosing Guide"  in media tab for additional information. Please  also refer to " Patient Self Inventory" in the Media  tab for reviewed elements of pertinent patient history.  Rocklyn Schellhammer participated in the discussions, expressed understanding, and voiced agreement with the above plans.  All questions were answered to her satisfaction. she is encouraged to contact clinic should she have any questions or concerns prior to her return visit.    Follow up plan: Return in about 4 months (around 05/26/2023) for F/U with Pre-visit Labs, Meter/CGM/Logs, A1c here.  Marquis Lunch, MD Phone: [phone]  Fax: [phone]   This note was partially dictated with voice recognition software. Similar sounding words can be transcribed inadequately or may not  be corrected upon review.  01/24/2023, 12:22 PM

## 2023-04-13 ENCOUNTER — Other Ambulatory Visit: Payer: Self-pay | Admitting: "Endocrinology

## 2023-04-13 DIAGNOSIS — E1065 Type 1 diabetes mellitus with hyperglycemia: Secondary | ICD-10-CM

## 2023-05-28 ENCOUNTER — Other Ambulatory Visit: Payer: Self-pay | Admitting: Nurse Practitioner

## 2023-05-28 DIAGNOSIS — E1065 Type 1 diabetes mellitus with hyperglycemia: Secondary | ICD-10-CM

## 2023-05-29 ENCOUNTER — Ambulatory Visit: Payer: Medicare HMO | Admitting: "Endocrinology

## 2023-06-15 ENCOUNTER — Other Ambulatory Visit: Payer: Self-pay | Admitting: "Endocrinology

## 2023-06-15 DIAGNOSIS — E890 Postprocedural hypothyroidism: Secondary | ICD-10-CM

## 2023-06-27 ENCOUNTER — Encounter: Payer: Self-pay | Admitting: "Endocrinology

## 2023-06-27 LAB — LAB REPORT - SCANNED
Calcium: 9.4
EGFR: 64
Free T4: 1.27 ng/dL
TSH: 0.53 (ref 0.41–5.90)

## 2023-07-03 ENCOUNTER — Telehealth: Payer: Self-pay | Admitting: "Endocrinology

## 2023-07-03 NOTE — Telephone Encounter (Signed)
 Patient asked if you could call [facility] and get her labs for her appt tomorrow?

## 2023-07-03 NOTE — Telephone Encounter (Signed)
 I called [facility] and that are faxing results over in a few minutes.

## 2023-07-04 ENCOUNTER — Ambulatory Visit: Payer: Medicare HMO | Admitting: "Endocrinology

## 2023-07-04 ENCOUNTER — Encounter: Payer: Self-pay | Admitting: "Endocrinology

## 2023-07-04 VITALS — BP 166/94 | HR 68 | Ht 71.0 in | Wt 196.0 lb

## 2023-07-04 DIAGNOSIS — E890 Postprocedural hypothyroidism: Secondary | ICD-10-CM | POA: Diagnosis not present

## 2023-07-04 DIAGNOSIS — E782 Mixed hyperlipidemia: Secondary | ICD-10-CM

## 2023-07-04 DIAGNOSIS — I10 Essential (primary) hypertension: Secondary | ICD-10-CM

## 2023-07-04 DIAGNOSIS — Z794 Long term (current) use of insulin: Secondary | ICD-10-CM | POA: Diagnosis not present

## 2023-07-04 DIAGNOSIS — E1065 Type 1 diabetes mellitus with hyperglycemia: Secondary | ICD-10-CM | POA: Diagnosis not present

## 2023-07-04 LAB — POCT GLYCOSYLATED HEMOGLOBIN (HGB A1C): HbA1c, POC (controlled diabetic range): 8.7 % — AB (ref 0.0–7.0)

## 2023-07-04 MED ORDER — TRESIBA FLEXTOUCH 100 UNIT/ML ~~LOC~~ SOPN: 45.0000 [IU] | PEN_INJECTOR | Freq: Every day | SUBCUTANEOUS | 2 refills | Status: DC

## 2023-07-04 NOTE — Patient Instructions (Signed)

## 2023-07-04 NOTE — Progress Notes (Signed)
 07/04/2023                  Endocrinology follow-up note  Subjective:    Patient ID: Kristi Estrada, female    DOB: 09/26/1950, PCP Christena Flake, MD.   Past Medical History:  Diagnosis Date   Diabetes mellitus type I (HCC)    Hyperthyroidism    History reviewed. No pertinent surgical history. Social History   Socioeconomic History   Marital status: Married    Spouse name: Not on file   Number of children: Not on file   Years of education: Not on file   Highest education level: Not on file  Occupational History   Not on file  Tobacco Use   Smoking status: Never   Smokeless tobacco: Never  Vaping Use   Vaping status: Never Used  Substance and Sexual Activity   Alcohol use: No    Alcohol/week: 0.0 standard drinks of alcohol   Drug use: No   Sexual activity: Not on file  Other Topics Concern   Not on file  Social History Narrative   Not on file   Social Drivers of Health   Financial Resource Strain: Not on file  Food Insecurity: Not on file  Transportation Needs: Not on file  Physical Activity: Unknown (02/28/2023)   Received from [facility]   Exercise Vital Sign    Days of Exercise per Week: Not on file    Minutes of Exercise per Session: 10 min  Stress: Not on file  Social Connections: Not on file   Outpatient Encounter Medications as of 07/04/2023  Medication Sig   alendronate (FOSAMAX) 35 MG tablet TAKE 1 TAB BY MOUTH ONCE A WEEK   B-D ULTRAFINE III SHORT PEN 31G X 8 MM MISC USE SUBCUTANEOUSLY 4 TIMES A DAY   cholecalciferol (VITAMIN D) 1000 units tablet Take 1,000 Units by mouth daily.   citalopram (CELEXA) 20 MG tablet Take 20 mg by mouth every morning.   Continuous Blood Gluc Receiver (FREESTYLE LIBRE 2 READER) DEVI As directed   Continuous Blood Gluc Sensor (FREESTYLE LIBRE 2 SENSOR) MISC 1 Piece by Does not apply route every 14 (fourteen) days.   hydrochlorothiazide (HYDRODIURIL) 25 MG tablet Take 25 mg by mouth daily.   insulin degludec (TRESIBA  FLEXTOUCH) 100 UNIT/ML FlexTouch Pen Inject 45 Units into the skin at bedtime.   levothyroxine (SYNTHROID) 100 MCG tablet TAKE 1 TABLET BY MOUTH DAILY BEFORE BREAKFAST.   losartan (COZAAR) 50 MG tablet Take by mouth.   metoprolol succinate (TOPROL-XL) 50 MG 24 hr tablet Take 50 mg by mouth daily.   mirtazapine (REMERON) 15 MG tablet at bedtime.   Multiple Vitamin (MULTIVITAMIN) tablet Take 1 tablet by mouth daily.   NOVOLOG FLEXPEN 100 UNIT/ML FlexPen INJECT 12-15 UNITS INTO THE SKIN 3 (THREE) TIMES DAILY WITH MEALS.   pravastatin (PRAVACHOL) 20 MG tablet    telmisartan (MICARDIS) 80 MG tablet Take 80 mg by mouth daily.   [DISCONTINUED] insulin degludec (TRESIBA FLEXTOUCH) 100 UNIT/ML FlexTouch Pen Inject 40 Units into the skin at bedtime.   No facility-administered encounter medications on file as of 07/04/2023.   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 -She is currently on levothyroxine 100 mcg p.o. daily before breakfast.   - She is also being followed for chronically uncontrolled diabetes now confirmed to be type 1.   She presents with suboptimal engagement for her multiple daily injections of insulin to treat her type 1 diabetes.  Her AGP report shows 40% time in range, 31% Level One hyperglycemia, 29% level 2 hyperglycemia.  She has no hypoglycemia.  She is currently grieving the death of her daughter.  Her point-of-care A1c is 8.7% increasing from 8.3% during her last visit.  Her most recent 2 weeks  average blood glucose is 208 mg per DL.    She remains on basal/bolus insulin.   She still has discoordinated meal/insulin timing, skips lunch most of the time.    She was diagnosed with diabetes at approximate age of 44.    The patient has family history of thyroid dysfunction  in her mother and in one of her daughters.  Review of Systems Limited as above.   Objective:    BP (!) 166/94   Pulse 68   Ht 5\' 11"  (1.803 m)   Wt 196 lb (88.9 kg)   BMI 27.34 kg/m   Wt Readings from Last 3 Encounters:  07/04/23 196 lb (88.9 kg)  01/24/23 195 lb 9.6 oz (88.7 kg)  09/15/22 196 lb 9.6 oz (89.2 kg)      Thyroid ultrasound from 11/16/2015 in [facility] showed: Right lobe 5.3 cm x 1.6 cm x 1.9 cm heterogenous and appearance. Left lobe 4.8 cm x 1.9 cm x 1.4 cm heterogenous with a 3.7 mm solid/hypoechoic nodule.  -  On 09/21/2016: Thyroid uptake and scan showed 50.8% of uniform uptake consistent with Graves' disease.  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 Graves' disease-  Resolved  She is advised to continue levothyroxine 100 mcg p.o. daily before breakfast.  - We discussed about the correct intake of her thyroid hormone, on empty stomach at fasting, with water, separated by at least 30 minutes from breakfast and other medications,  and separated by more than 4 hours from calcium, iron, multivitamins, acid reflux medications (PPIs). -Patient is made aware of the fact that thyroid hormone  replacement is needed for life, dose to be adjusted by periodic monitoring of thyroid function tests.   3.  Type 1 diabetes Her diabetes is now confirmed to be type 1.  She is currently grieving the death of her daughter, presents with suboptimal engagement.  See her AGP reports above.    -She will continue to need intensive treatment with basal/bolus insulin in order for her to achieve control of diabetes target.    -She worries about overnight hypoglycemia.  Based on her presentation, she is advised to increase [place]  to 45 units nightly, continue NovoLog 12 -15 units 3 times daily AC   for Premeal blood glucose readings above 90 mg per DL, associated with strict monitoring of blood glucose 4 times a day-before meals and at bedtime.   -She is encouraged to call clinic for hypoglycemia under 70 mg per DL, or hyperglycemia above 200 mg/dl weekly average. -She is  encouraged to continue to use her CGM device at all times.   - she acknowledges that there is a room for improvement in her food and drink choices. - Suggestion is made for her to avoid simple carbohydrates  from her diet including Cakes, Sweet Desserts, Ice Cream, Soda (diet and regular), Sweet Tea, Candies, Chips, Cookies, Store Bought Juices, Alcohol in Excess of  1-2 drinks a day, Artificial Sweeteners,  Coffee Creamer, and "Sugar-free" Products, Lemonade. This will help patient to have more stable blood glucose profile and potentially avoid unintended weight gain.   -She is encouraged to call clinic for hypoglycemia below 70 or greater than 200x3.   4.  Hypertension:  Her blood pressure is not controlled to target.  She has not taken her blood pressure medications this morning.  She is advised to continue Micardis 80 mg p.o. daily at breakfast, hydrochlorothiazide 25 mg p.o. daily at breakfast.  She is advised on salt restrictions.  Her diabetes foot exam is normal except for bilateral bunions.  5) hyperlipidemia: She is  advised to continue pravastatin 20 mg p.o. nightly.  Her most recent lipid panel showed LDL controlled at 48.  Side effects and precautions discussed with her. - I advised patient to maintain close follow up with Christena Flake, MD for her diabetes and primary care needs.   I spent  41  minutes in the care of the patient today including review of labs from CMP, Lipids, Thyroid Function, Hematology (current and previous including abstractions from other facilities); face-to-face time discussing  her blood glucose readings/logs, discussing hypoglycemia and hyperglycemia episodes and symptoms, medications doses, her options of short and long term treatment based on the latest standards of care / guidelines;  discussion about incorporating lifestyle medicine;  and documenting the encounter. Risk reduction counseling performed per USPSTF guidelines to reduce cardiovascular risk factors.     Please refer to Patient Instructions for Blood Glucose Monitoring and Insulin/Medications Dosing Guide"  in media tab for additional information. Please  also refer to " Patient Self Inventory" in the Media  tab for reviewed elements of pertinent patient history.  Luwanna Brossman participated in the discussions, expressed understanding, and voiced agreement with the above plans.  All questions were answered to her satisfaction. she is encouraged to contact clinic should she have any questions or concerns prior to her return visit.    Follow up plan: Return in about 4 months (around 11/01/2023) for F/U with Pre-visit Labs, Meter/CGM/Logs, A1c here.  Marquis Lunch, MD Phone: [phone]  Fax: [phone]   This note was partially dictated with voice recognition software. Similar sounding words can be transcribed inadequately or may not  be corrected upon review.  07/04/2023, 12:44 PM

## 2023-08-22 ENCOUNTER — Other Ambulatory Visit: Payer: Self-pay | Admitting: "Endocrinology

## 2023-08-22 DIAGNOSIS — E890 Postprocedural hypothyroidism: Secondary | ICD-10-CM

## 2023-10-25 ENCOUNTER — Encounter: Payer: Self-pay | Admitting: "Endocrinology

## 2023-10-25 LAB — LAB REPORT - SCANNED
EGFR: 64
Free T4: 1.27 ng/dL
TSH: 0.53 (ref 0.41–5.90)

## 2023-10-28 ENCOUNTER — Other Ambulatory Visit: Payer: Self-pay | Admitting: "Endocrinology

## 2023-10-28 DIAGNOSIS — E890 Postprocedural hypothyroidism: Secondary | ICD-10-CM

## 2023-11-01 ENCOUNTER — Ambulatory Visit: Payer: Medicare HMO | Admitting: "Endocrinology

## 2023-11-01 ENCOUNTER — Encounter: Payer: Self-pay | Admitting: "Endocrinology

## 2023-11-01 VITALS — BP 112/66 | HR 64 | Ht 71.0 in | Wt 197.6 lb

## 2023-11-01 DIAGNOSIS — E890 Postprocedural hypothyroidism: Secondary | ICD-10-CM

## 2023-11-01 DIAGNOSIS — I10 Essential (primary) hypertension: Secondary | ICD-10-CM

## 2023-11-01 DIAGNOSIS — Z794 Long term (current) use of insulin: Secondary | ICD-10-CM | POA: Diagnosis not present

## 2023-11-01 DIAGNOSIS — E1065 Type 1 diabetes mellitus with hyperglycemia: Secondary | ICD-10-CM | POA: Diagnosis not present

## 2023-11-01 DIAGNOSIS — E782 Mixed hyperlipidemia: Secondary | ICD-10-CM

## 2023-11-01 LAB — POCT GLYCOSYLATED HEMOGLOBIN (HGB A1C): HbA1c, POC (controlled diabetic range): 8.6 % — AB (ref 0.0–7.0)

## 2023-11-01 MED ORDER — FREESTYLE LIBRE 3 PLUS SENSOR MISC
2 refills | Status: DC
Start: 2023-11-01 — End: 2024-02-22

## 2023-11-01 MED ORDER — FREESTYLE LIBRE 3 READER DEVI: 1.0000 | Freq: Once | 0 refills | Status: DC | PRN

## 2023-11-01 MED ORDER — TRESIBA FLEXTOUCH 100 UNIT/ML ~~LOC~~ SOPN
45.0000 [IU] | PEN_INJECTOR | Freq: Every day | SUBCUTANEOUS | 2 refills | Status: DC
Start: 2023-11-01 — End: 2023-11-24

## 2023-11-01 MED ORDER — NOVOLOG FLEXPEN 100 UNIT/ML ~~LOC~~ SOPN: 12.0000 [IU] | PEN_INJECTOR | Freq: Three times a day (TID) | SUBCUTANEOUS | 1 refills | Status: AC

## 2023-11-01 NOTE — Patient Instructions (Signed)

## 2023-11-01 NOTE — Progress Notes (Signed)
 11/01/2023                  Endocrinology follow-up note  Subjective:    Patient ID: Kristi Estrada, female    DOB: 07-26-1950, PCP Hanna Fallow, MD.   Past Medical History:  Diagnosis Date   Diabetes mellitus type I (HCC)    Hyperthyroidism    History reviewed. No pertinent surgical history. Social History   Socioeconomic History   Marital status: Married    Spouse name: Not on file   Number of children: Not on file   Years of education: Not on file   Highest education level: Not on file  Occupational History   Not on file  Tobacco Use   Smoking status: Never   Smokeless tobacco: Never  Vaping Use   Vaping status: Never Used  Substance and Sexual Activity   Alcohol use: No    Alcohol/week: 0.0 standard drinks of alcohol   Drug use: No   Sexual activity: Not on file  Other Topics Concern   Not on file  Social History Narrative   Not on file   Social Drivers of Health   Financial Resource Strain: Not on file  Food Insecurity: Not on file  Transportation Needs: Not on file  Physical Activity: Unknown (02/28/2023)   Received from [facility]   Exercise Vital Sign    Days of Exercise per Week: Not on file    On average, how many minutes do you engage in exercise at this level?: 10 min  Stress: Not on file  Social Connections: Not on file   Outpatient Encounter Medications as of 11/01/2023  Medication Sig   Continuous Glucose Receiver (FREESTYLE LIBRE 3 READER) DEVI 1 Piece by Does not apply route once as needed for up to 1 dose.   Continuous Glucose Sensor (FREESTYLE LIBRE 3 PLUS SENSOR) MISC Change sensor every 15 days.   [DISCONTINUED] insulin  degludec (TRESIBA  FLEXTOUCH) 100 UNIT/ML FlexTouch Pen Inject 45 Units into the skin at bedtime. (Patient taking differently: Inject 38-40 Units into the skin at bedtime.)   alendronate (FOSAMAX) 35 MG tablet TAKE 1 TAB BY MOUTH ONCE A WEEK   B-D ULTRAFINE III SHORT PEN 31G X 8 MM MISC USE SUBCUTANEOUSLY 4 TIMES A DAY    cholecalciferol (VITAMIN D) 1000 units tablet Take 1,000 Units by mouth daily.   citalopram (CELEXA) 20 MG tablet Take 20 mg by mouth every morning.   hydrochlorothiazide (HYDRODIURIL) 25 MG tablet Take 25 mg by mouth daily.   insulin  degludec (TRESIBA  FLEXTOUCH) 100 UNIT/ML FlexTouch Pen Inject 45 Units into the skin at bedtime.   levothyroxine  (SYNTHROID ) 100 MCG tablet TAKE 1 TABLET BY MOUTH DAILY BEFORE BREAKFAST.   losartan (COZAAR) 50 MG tablet Take by mouth.   metoprolol succinate (TOPROL-XL) 50 MG 24 hr tablet Take 50 mg by mouth daily.   mirtazapine (REMERON) 15 MG tablet at bedtime.   Multiple Vitamin (MULTIVITAMIN) tablet Take 1 tablet by mouth daily.   NOVOLOG  FLEXPEN 100 UNIT/ML FlexPen Inject 12-18 Units into the skin 3 (three) times daily with meals.   pravastatin (PRAVACHOL) 20 MG tablet    telmisartan (MICARDIS) 80 MG tablet Take 80 mg by mouth daily.   [DISCONTINUED] Continuous Blood Gluc Receiver (FREESTYLE LIBRE 2 READER) DEVI As directed   [DISCONTINUED] Continuous Blood Gluc Sensor (FREESTYLE LIBRE 2 SENSOR) MISC 1 Piece by Does not apply route every 14 (fourteen) days.   [DISCONTINUED] levothyroxine  (SYNTHROID ) 88 MCG tablet TAKE 1 TABLET BY MOUTH EVERY  DAY BEFORE BREAKFAST   [DISCONTINUED] NOVOLOG  FLEXPEN 100 UNIT/ML FlexPen INJECT 12-15 UNITS INTO THE SKIN 3 (THREE) TIMES DAILY WITH MEALS.   No facility-administered encounter medications on file as of 11/01/2023.   ALLERGIES: Allergies  Allergen Reactions   Metformin Nausea Only   VACCINATION STATUS: Immunization History  Administered Date(s) Administered   Moderna Covid-19 Fall Seasonal Vaccine 35yrs & older 02/24/2022, 01/12/2023   Moderna Sars-Cov-2 Peds vaccine 2yrs thru 46yrs 02/24/2022, 01/12/2023   Moderna Sars-Covid-2 Vaccination 07/10/2019, 08/07/2019, 03/26/2020, 09/09/2020    HPI  - She is  a 73 year old female patient  status post  RAI induced hypothyroidism related to her treatment for Graves'  disease on 10/07/2016. -She is currently on levothyroxine  100 mcg p.o. daily before breakfast.   - She is also being followed for chronically uncontrolled diabetes now confirmed to be type 1.   She presents with suboptimal engagement for her multiple daily injections of insulin  to treat her type 1 diabetes.  Her AGP report shows 31% time range, 32% level 1 hyperglycemia, 37% level 2 hyperglycemia.  Patient did not call for hide per glycemia.  She has lowered her Tresiba  by herself to 38 units from 45 units recommended.    Her point-of-care A1c remains high at 8.6%  increasing from 8.3% during her last visit.  Her most recent 2 weeks average blood glucose is 223 mg per DL.    She remains on basal/bolus insulin .   She still has discoordinated meal/insulin  timing, skips lunch most of the time.    She was diagnosed with diabetes at approximate age of 83.    The patient has family history of thyroid  dysfunction  in her mother and in one of her daughters.  Review of Systems Limited as above.   Objective:    BP 112/66   Pulse 64   Ht 5' 11 (1.803 m)   Wt 197 lb 9.6 oz (89.6 kg)   BMI 27.56 kg/m   Wt Readings from Last 3 Encounters:  11/01/23 197 lb 9.6 oz (89.6 kg)  07/04/23 196 lb (88.9 kg)  01/24/23 195 lb 9.6 oz (88.7 kg)      Thyroid  ultrasound from 11/16/2015 in [facility] showed: Right lobe 5.3 cm x 1.6 cm x 1.9 cm heterogenous and appearance. Left lobe 4.8 cm x 1.9 cm x 1.4 cm heterogenous with a 3.7 mm solid/hypoechoic nodule.  -  On 09/21/2016: Thyroid  uptake and scan showed 50.8% of uniform uptake consistent with Graves' disease.  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 Graves' disease-  Resolved  She is advised to continue levothyroxine  100 mcg p.o. daily before breakfast.  - We discussed about the correct intake of her thyroid  hormone, on empty stomach at fasting, with water, separated by at least 30 minutes from breakfast and other medications,  and separated by more than 4  hours from calcium, iron, multivitamins, acid reflux medications (PPIs). -Patient is made aware of the fact that thyroid  hormone replacement is needed for life, dose to be adjusted by periodic monitoring of thyroid  function tests.   3.  Type 1 diabetes Her diabetes is now confirmed to be type 1.  She is currently grieving the death of her daughter, presents with suboptimal engagement.  See her AGP reports above.    -She will continue to need intensive treatment with basal/bolus insulin  in order for her to achieve control of diabetes target.    -She worries about overnight hypoglycemia.  Based on her presentation, she will need a higher dose of basal insulin .  I discussed and increase her Tresiba  back to 45 units nightly,  continue NovoLog  12 -18 units 3 times daily AC   for Preme[place] blood glucose readings above 90 mg per DL, associated with strict monitoring of blood glucose 4 times a day-before meals and at bedtime.   -She is encouraged to call clinic for hypoglycemia under 70 mg per DL, or hyperglycemia above 200 mg/dl weekly average. -She is  encouraged to continue to use her CGM device at all times.   - she acknowledges that there is a room for improvement in her food and drink choices. - Suggestion is made for her to avoid simple carbohydrates  from her diet including Cakes, Sweet Desserts, Ice Cream, Soda (diet and regular), Sweet Tea, Candies, Chips, Cookies, Store Bought Juices,  Alcohol in Excess of  1-2 drinks a day, Artificial Sweeteners,  Coffee Creamer, and Sugar-free Products, Lemonade. This will help patient to have more stable blood glucose profile and potentially avoid unintended weight gain.  -She is encouraged to call clinic for hypoglycemia below 70 or greater than 200x3.   4.  Hypertension:  Her blood pressure is controlled to target.   She is advised to continue Micardis 80 mg p.o. daily at breakfast, hydrochlorothiazide 25 mg p.o. daily at breakfast.  She is advised on salt restrictions.  Her diabetes foot exam is normal except for bilateral bunions.  5) hyperlipidemia: She is advised to continue pravastatin 20 mg p.o. nightly.  Her most recent lipid panel showed LDL controlled at 48.  Side effects and precautions discussed with her. - I advised patient to maintain close follow up with Hanna Fallow, MD for her diabetes and primary care needs.   I spent  26  minutes in the care of the patient today including review of labs from CMP, Lipids, Thyroid  Function, Hematology (current and previous including abstractions from other facilities); face-to-face time discussing  her blood glucose readings/logs, discussing hypoglycemia and hyperglycemia episodes and symptoms, medications doses, her options of short and long term treatment based on the latest standards of care / guidelines;  discussion about incorporating lifestyle medicine;  and documenting the encounter. Risk reduction counseling performed per USPSTF guidelines to reduce cardiovascular risk factors.     Please refer to Patient Instructions for Blood Glucose Monitoring and Insulin /Medications Dosing Guide  in media tab for additional information. Please  also refer to  Patient Self Inventory in the Media  tab for reviewed elements of pertinent patient history.  Aundreya Souffrant participated in the discussions, expressed understanding, and voiced agreement with the above plans.  All questions were  answered to her satisfaction. she is encouraged to contact clinic should she have any questions or concerns prior to her return visit.   Follow up plan: Return in about 4 months (around 03/02/2024) for F/U with Pre-visit  Labs, Meter/CGM/Logs, A1c here.  Ranny Earl, MD Phone: [phone]  Fax: [phone]   This note was partially dictated with voice recognition software. Similar sounding words can be transcribed inadequately or may not  be corrected upon review.  11/01/2023, 11:26 AM

## 2023-11-24 ENCOUNTER — Other Ambulatory Visit: Payer: Self-pay | Admitting: "Endocrinology

## 2023-11-24 DIAGNOSIS — E1065 Type 1 diabetes mellitus with hyperglycemia: Secondary | ICD-10-CM

## 2023-12-05 ENCOUNTER — Other Ambulatory Visit: Payer: Self-pay | Admitting: "Endocrinology

## 2024-01-02 ENCOUNTER — Other Ambulatory Visit: Payer: Self-pay | Admitting: "Endocrinology

## 2024-01-02 DIAGNOSIS — E890 Postprocedural hypothyroidism: Secondary | ICD-10-CM

## 2024-02-03 ENCOUNTER — Other Ambulatory Visit: Payer: Self-pay | Admitting: "Endocrinology

## 2024-02-03 DIAGNOSIS — E890 Postprocedural hypothyroidism: Secondary | ICD-10-CM

## 2024-02-20 LAB — LAB REPORT - SCANNED
EGFR: 57
Free T4: 1.28
TSH: 0.95 (ref 0.41–5.90)

## 2024-02-22 ENCOUNTER — Other Ambulatory Visit: Payer: Self-pay | Admitting: "Endocrinology

## 2024-02-28 ENCOUNTER — Ambulatory Visit: Admitting: "Endocrinology

## 2024-02-28 ENCOUNTER — Encounter: Payer: Self-pay | Admitting: "Endocrinology

## 2024-02-28 VITALS — BP 132/76 | HR 68 | Ht 71.0 in | Wt 207.6 lb

## 2024-02-28 DIAGNOSIS — I10 Essential (primary) hypertension: Secondary | ICD-10-CM

## 2024-02-28 DIAGNOSIS — Z794 Long term (current) use of insulin: Secondary | ICD-10-CM | POA: Diagnosis not present

## 2024-02-28 DIAGNOSIS — E1065 Type 1 diabetes mellitus with hyperglycemia: Secondary | ICD-10-CM | POA: Diagnosis not present

## 2024-02-28 DIAGNOSIS — E782 Mixed hyperlipidemia: Secondary | ICD-10-CM

## 2024-02-28 DIAGNOSIS — E890 Postprocedural hypothyroidism: Secondary | ICD-10-CM | POA: Diagnosis not present

## 2024-02-28 LAB — POCT GLYCOSYLATED HEMOGLOBIN (HGB A1C): HbA1c, POC (controlled diabetic range): 9 % — AB (ref 0.0–7.0)

## 2024-02-28 MED ORDER — TRESIBA FLEXTOUCH 100 UNIT/ML ~~LOC~~ SOPN: 50.0000 [IU] | PEN_INJECTOR | Freq: Every day | SUBCUTANEOUS | 1 refills | Status: DC

## 2024-02-28 NOTE — Patient Instructions (Signed)

## 2024-02-28 NOTE — Progress Notes (Signed)
 02/28/2024                  Endocrinology follow-up note  Subjective:    Patient ID: Kristi Estrada, female    DOB: Apr 19, 1951, PCP Kristi Fallow, MD.   Past Medical History:  Diagnosis Date   Diabetes mellitus type I (HCC)    Hyperthyroidism    History reviewed. No pertinent surgical history. Social History   Socioeconomic History   Marital status: Married    Spouse name: Not on file   Number of children: Not on file   Years of education: Not on file   Highest education level: Not on file  Occupational History   Not on file  Tobacco Use   Smoking status: Never   Smokeless tobacco: Never  Vaping Use   Vaping status: Never Used  Substance and Sexual Activity   Alcohol use: No    Alcohol/week: 0.0 standard drinks of alcohol   Drug use: No   Sexual activity: Not on file  Other Topics Concern   Not on file  Social History Narrative   Not on file   Social Drivers of Health   Financial Resource Strain: Not on file  Food Insecurity: Not on file  Transportation Needs: Not on file  Physical Activity: Unknown (02/28/2023)   Received from [facility]   Exercise Vital Sign    Days of Exercise per Week: Not on file    On average, how many minutes do you engage in exercise at this level?: 10 min  Stress: Not on file  Social Connections: Not on file   Outpatient Encounter Medications as of 02/28/2024  Medication Sig   alendronate (FOSAMAX) 35 MG tablet TAKE 1 TAB BY MOUTH ONCE A WEEK   B-D ULTRAFINE III SHORT PEN 31G X 8 MM MISC USE SUBCUTANEOUSLY 4 TIMES A DAY   cholecalciferol (VITAMIN D) 1000 units tablet Take 1,000 Units by mouth daily.   citalopram (CELEXA) 20 MG tablet Take 20 mg by mouth every morning.   Continuous Glucose Receiver (FREESTYLE LIBRE 3 READER) DEVI 1 PIECE BY DOES NOT APPLY ROUTE ONCE AS NEEDED FOR UP TO 1 DOSE.   Continuous Glucose Sensor (FREESTYLE LIBRE 3 SENSOR) MISC CHANGE SENSOR EVERY 15 DAYS.   hydrochlorothiazide (HYDRODIURIL) 25 MG  tablet Take 25 mg by mouth daily.   insulin  degludec (TRESIBA  FLEXTOUCH) 100 UNIT/ML FlexTouch Pen Inject 50 Units into the skin at bedtime.   levothyroxine  (SYNTHROID ) 100 MCG tablet TAKE 1 TABLET BY MOUTH DAILY BEFORE BREAKFAST.   losartan (COZAAR) 50 MG tablet Take by mouth.   metoprolol succinate (TOPROL-XL) 50 MG 24 hr tablet Take 50 mg by mouth daily.   mirtazapine (REMERON) 15 MG tablet at bedtime.   Multiple Vitamin (MULTIVITAMIN) tablet Take 1 tablet by mouth daily.   NOVOLOG  FLEXPEN 100 UNIT/ML FlexPen Inject 12-18 Units into the skin 3 (three) times daily with meals.   pravastatin (PRAVACHOL) 20 MG tablet    telmisartan (MICARDIS) 80 MG tablet Take 80 mg by mouth daily.   [DISCONTINUED] insulin  degludec (TRESIBA  FLEXTOUCH) 100 UNIT/ML FlexTouch Pen Inject 45 Units into the skin at bedtime.   No facility-administered encounter medications on file as of 02/28/2024.   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 -She is currently on levothyroxine  100 mcg p.o. daily before breakfast.   - She is also being followed for chronically uncontrolled diabetes now confirmed to be type 1.   She presents with suboptimal engagement for her multiple daily injections of insulin  to treat her type 1 diabetes.  AGP report shows 30% time range, 36% level 1 hyperglycemia, 34% level 2 hyperglycemia.   Her average blood glucose is 223 mg per DL, point-of-care J8r of 9% increasing from 8.6%.  She did not document any  hypoglycemia.  Her glucose variability is 32.8%.   She remains on basal/bolus insulin .  She has been missing at least a third of her prandial insulin  after changes. She was diagnosed with diabetes at approximate age of 17, reclassified as LADA few years ago.   The patient has family history of thyroid  dysfunction  in her mother and in one of her daughters.  Review of Systems  Neurological:  Seizures: .1t.   Limited as above.   Objective:    BP 132/76   Pulse 68   Ht 5' 11 (1.803 m)   Wt 207 lb 9.6 oz (94.2 kg)   BMI 28.95 kg/m   Wt Readings from Last 3 Encounters:  02/28/24 207 lb 9.6 oz (94.2 kg)  11/01/23 197 lb 9.6 oz (89.6 kg)  07/04/23 196 lb (88.9 kg)      Thyroid  ultrasound from 11/16/2015 in [facility] showed: Right lobe 5.3 cm x 1.6 cm x 1.9 cm heterogenous and appearance. Left lobe 4.8 cm x 1.9 cm x 1.4 cm heterogenous with a 3.7 mm solid/hypoechoic nodule.  -  On 09/21/2016: Thyroid  uptake and scan showed 50.8% of uniform uptake consistent with Graves' disease.  Recent Results (from the past 2160 hours)  Lab report - scanned     Status: None   Collection Time: 02/20/24 12:00 AM  Result Value Ref Range   EGFR 57.0     Comment: Abstracted by HIM   TSH 0.95 0.41 - 5.90   Free T4 1.28   HgB A1c     Status: Abnormal   Collection Time: 02/28/24 10:38 AM  Result Value Ref Range   Hemoglobin A1C     HbA1c POC (<> result, manual entry)     HbA1c, POC (prediabetic range)     HbA1c, POC (controlled diabetic range) 9.0 (A) 0.0 - 7.0 %    Lipid Panel     Component Value Date/Time   CHOL 151 04/05/2021 0000   TRIG 72 04/05/2021 0000   HDL 89 (A) 04/05/2021 0000   LDLCALC 48 04/05/2021 0000    Assessment & Plan:   1. 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 Graves' disease-  Resolved  She is advised to continue levothyroxine  100 mcg p.o. daily before breakfast.  Her previsit thyroid  function tests are consistent with appropriate  replacement.    - We discussed about the correct intake of her thyroid  hormone, on empty stomach at fasting, with water, separated by at least 30 minutes from breakfast and other medications,  and separated by more than 4 hours from calcium, iron, multivitamins, acid reflux medications (PPIs). -Patient is made aware of the fact that thyroid  hormone replacement is needed for life, dose to be adjusted by periodic monitoring of thyroid  function tests.   3.  Type 1 diabetes  Her diabetes is now confirmed to be type 1.  She is currently grieving the death of her daughter, presents with suboptimal engagement.  See her AGP reports above.    -She will continue to need intensive treatment with basal/bolus insulin  in order for her to achieve control of diabetes target.    -She worries about overnight hypoglycemia.  Based on her presentation, she will need a higher dose of basal insulin .  I discussed and increased her Tresiba  to 50 units nightly,   continue NovoLog  12 -15 units 3 times daily AC   for Premeal blood glucose readings above 90 mg per DL, associated with strict monitoring of blood glucose 4 times a day-before meals and at bedtime.   -She is encouraged to call clinic for hypoglycemia under 70 mg per DL, or hyperglycemia above 200 mg/dl weekly average. -She is  encouraged to continue to use her CGM device at all times.  - she acknowledges that there is a room for improvement in her food and drink choices. - Suggestion is made for her to avoid simple carbohydrates  from her diet including Cakes, Sweet Desserts, Ice Cream, Soda (diet and regular), Sweet Tea, Candies, Chips, Cookies, Store Bought Juices, Alcohol , Artificial Sweeteners,  Coffee Creamer, and Sugar-free Products, Lemonade. This will help patient to have more stable blood glucose profile and potentially avoid unintended weight gain.  -She is encouraged to call clinic for hypoglycemia below 70 or greater than 200x3.   4.   Hypertension:  Her blood pressure is controlled to target.   She is advised to continue Micardis 80 mg p.o. daily at breakfast, hydrochlorothiazide 25 mg p.o. daily at breakfast.  She is advised on salt restrictions.  Her diabetes foot exam is normal except for bilateral bunions.  5) hyperlipidemia: She is advised to continue pravastatin 20 mg p.o. nightly .  Her most recent lipid panel showed LDL controlled at 48.  Side effects and precautions discussed with her.   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 - I advised patient to maintain close follow up with Kristi Fallow, MD for her diabetes and primary care needs.   I spent  26  minutes in the care of the patient today including review of labs from CMP, Lipids, Thyroid  Function, Hematology (current and previous including abstractions from other facilities); face-to-face time discussing  her blood glucose readings/logs, discussing hypoglycemia and hyperglycemia episodes and symptoms, medications doses, her options of short and long term treatment based on the latest standards of care / guidelines;  discussion about incorporating lifestyle medicine;  and documenting the encounter. Risk reduction counseling performed per USPSTF guidelines to reduce cardiovascular risk factors.     Please refer to Patient Instructions for Blood Glucose Monitoring and Insulin /Medications Dosing Guide  in media tab for additional information. Please  also refer to  Patient Self Inventory in the Media  tab for reviewed elements of pertinent patient history.  Kristi Estrada participated in the discussions, expressed understanding, and voiced agreement with the above plans.  All questions were answered to her satisfaction. she is encouraged to contact clinic should she have any questions or concerns prior to her return visit.   Follow up plan: Return in about 4 months (around 06/30/2024) for F/U with Pre-visit Labs,  Meter/CGM/Logs, A1c here.  Ranny Earl, MD Phone: [phone]  Fax: [phone]   This note was partially dictated with voice recognition software. Similar sounding words can be transcribed inadequately or may  not  be corrected upon review.  02/28/2024, 11:07 AM

## 2024-04-27 ENCOUNTER — Other Ambulatory Visit: Payer: Self-pay | Admitting: "Endocrinology

## 2024-04-27 DIAGNOSIS — E1065 Type 1 diabetes mellitus with hyperglycemia: Secondary | ICD-10-CM

## 2024-05-03 ENCOUNTER — Other Ambulatory Visit: Payer: Self-pay | Admitting: "Endocrinology

## 2024-05-03 DIAGNOSIS — E890 Postprocedural hypothyroidism: Secondary | ICD-10-CM

## 2024-07-04 ENCOUNTER — Ambulatory Visit: Admitting: "Endocrinology
